# Patient Record
Sex: Female | Born: 1995 | Race: Black or African American | Hispanic: No | Marital: Single | Smoking: Never smoker
Health system: Southern US, Community
[De-identification: ages and names within clinical notes are randomized; demographics above are authoritative.]

## PROBLEM LIST (undated history)

## (undated) DIAGNOSIS — D649 Anemia, unspecified: Secondary | ICD-10-CM

## (undated) DIAGNOSIS — H209 Unspecified iridocyclitis: Secondary | ICD-10-CM

## (undated) DIAGNOSIS — H3581 Retinal edema: Secondary | ICD-10-CM

## (undated) DIAGNOSIS — F419 Anxiety disorder, unspecified: Secondary | ICD-10-CM

## (undated) DIAGNOSIS — H21543 Posterior synechiae (iris), bilateral: Secondary | ICD-10-CM

## (undated) DIAGNOSIS — R87629 Unspecified abnormal cytological findings in specimens from vagina: Secondary | ICD-10-CM

## (undated) DIAGNOSIS — H3090 Unspecified chorioretinal inflammation, unspecified eye: Secondary | ICD-10-CM

## (undated) DIAGNOSIS — E7800 Pure hypercholesterolemia, unspecified: Secondary | ICD-10-CM

## (undated) DIAGNOSIS — I10 Essential (primary) hypertension: Secondary | ICD-10-CM

## (undated) DIAGNOSIS — B977 Papillomavirus as the cause of diseases classified elsewhere: Secondary | ICD-10-CM

## (undated) HISTORY — PX: WISDOM TOOTH EXTRACTION: SHX21

## (undated) HISTORY — DX: Essential (primary) hypertension: I10

## (undated) HISTORY — DX: Pure hypercholesterolemia, unspecified: E78.00

## (undated) HISTORY — PX: COLPOSCOPY: SHX161

## (undated) HISTORY — DX: Unspecified iridocyclitis: H20.9

---

## 2014-12-20 ENCOUNTER — Emergency Department (HOSPITAL_COMMUNITY)
Admission: EM | Admit: 2014-12-20 | Discharge: 2014-12-20 | Disposition: A | Discharge status: Home / Self Care | Payer: BLUE CROSS/BLUE SHIELD | Attending: Emergency Medicine | Admitting: Emergency Medicine

## 2014-12-20 ENCOUNTER — Encounter (HOSPITAL_COMMUNITY): Payer: Self-pay | Admitting: *Deleted

## 2014-12-20 DIAGNOSIS — S0990XA Unspecified injury of head, initial encounter: Secondary | ICD-10-CM | POA: Diagnosis present

## 2014-12-20 DIAGNOSIS — Y998 Other external cause status: Secondary | ICD-10-CM | POA: Insufficient documentation

## 2014-12-20 DIAGNOSIS — Y92410 Unspecified street and highway as the place of occurrence of the external cause: Secondary | ICD-10-CM | POA: Diagnosis not present

## 2014-12-20 DIAGNOSIS — Y9389 Activity, other specified: Secondary | ICD-10-CM | POA: Diagnosis not present

## 2014-12-20 DIAGNOSIS — R519 Headache, unspecified: Secondary | ICD-10-CM

## 2014-12-20 DIAGNOSIS — R51 Headache: Secondary | ICD-10-CM

## 2014-12-20 MED ORDER — IBUPROFEN 400 MG PO TABS
800.0000 mg | ORAL_TABLET | Freq: Once | ORAL | Status: AC
  Administered 2014-12-20: 800 mg via ORAL
  Filled 2014-12-20: qty 2
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
## 2014-12-20 NOTE — Discharge Instructions (Signed)
Return to the emergency room with worsening of symptoms, new symptoms or with symptoms that are concerning , especially severe worsening of headache, visual or speech changes, weakness in face, arms or legs. Ibuprofen 400mg  (2 tablets 200mg ) every 5-6 hours for 3-5 days Follow up with PCP if symptoms worsen or are persistent. Reviewed below information and follow recommendations.  Concussion A concussion, or closed-head injury, is a brain injury caused by a direct blow to the head or by a quick and sudden movement (jolt) of the head or neck. Concussions are usually not life-threatening. Even so, the effects of a concussion can be serious. If you have had a concussion before, you are more likely to experience concussion-like symptoms after a direct blow to the head.  CAUSES  Direct blow to the head, such as from running into another player during a soccer game, being hit in a fight, or hitting your head on a hard surface.  A jolt of the head or neck that causes the brain to move back and forth inside the skull, such as in a car crash. SIGNS AND SYMPTOMS The signs of a concussion can be hard to notice. Early on, they may be missed by you, family members, and health care providers. You may look fine but act or feel differently. Symptoms are usually temporary, but they may last for days, weeks, or even longer. Some symptoms may appear right away while others may not show up for hours or days. Every head injury is different. Symptoms include:  Mild to moderate headaches that will not go away.  A feeling of pressure inside your head.  Having more trouble than usual:  Learning or remembering things you have heard.  Answering questions.  Paying attention or concentrating.  Organizing daily tasks.  Making decisions and solving problems.  Slowness in thinking, acting or reacting, speaking, or reading.  Getting lost or being easily confused.  Feeling tired all the time or lacking energy  (fatigued).  Feeling drowsy.  Sleep disturbances.  Sleeping more than usual.  Sleeping less than usual.  Trouble falling asleep.  Trouble sleeping (insomnia).  Loss of balance or feeling lightheaded or dizzy.  Nausea or vomiting.  Numbness or tingling.  Increased sensitivity to:  Sounds.  Lights.  Distractions.  Vision problems or eyes that tire easily.  Diminished sense of taste or smell.  Ringing in the ears.  Mood changes such as feeling sad or anxious.  Becoming easily irritated or angry for little or no reason.  Lack of motivation.  Seeing or hearing things other people do not see or hear (hallucinations). DIAGNOSIS Your health care provider can usually diagnose a concussion based on a description of your injury and symptoms. He or she will ask whether you passed out (lost consciousness) and whether you are having trouble remembering events that happened right before and during your injury. Your evaluation might include:  A brain scan to look for signs of injury to the brain. Even if the test shows no injury, you may still have a concussion.  Blood tests to be sure other problems are not present. TREATMENT  Concussions are usually treated in an emergency department, in urgent care, or at a clinic. You may need to stay in the hospital overnight for further treatment.  Tell your health care provider if you are taking any medicines, including prescription medicines, over-the-counter medicines, and natural remedies. Some medicines, such as blood thinners (anticoagulants) and aspirin, may increase the chance of complications. Also tell your  health care provider whether you have had alcohol or are taking illegal drugs. This information may affect treatment.  Your health care provider will send you home with important instructions to follow.  How fast you will recover from a concussion depends on many factors. These factors include how severe your concussion is,  what part of your brain was injured, your age, and how healthy you were before the concussion.  Most people with mild injuries recover fully. Recovery can take time. In general, recovery is slower in older persons. Also, persons who have had a concussion in the past or have other medical problems may find that it takes longer to recover from their current injury. HOME CARE INSTRUCTIONS General Instructions  Carefully follow the directions your health care provider gave you.  Only take over-the-counter or prescription medicines for pain, discomfort, or fever as directed by your health care provider.  Take only those medicines that your health care provider has approved.  Do not drink alcohol until your health care provider says you are well enough to do so. Alcohol and certain other drugs may slow your recovery and can put you at risk of further injury.  If it is harder than usual to remember things, write them down.  If you are easily distracted, try to do one thing at a time. For example, do not try to watch TV while fixing dinner.  Talk with family members or close friends when making important decisions.  Keep all follow-up appointments. Repeated evaluation of your symptoms is recommended for your recovery.  Watch your symptoms and tell others to do the same. Complications sometimes occur after a concussion. Older adults with a brain injury may have a higher risk of serious complications, such as a blood clot on the brain.  Tell your teachers, school nurse, school counselor, coach, athletic trainer, or work Freight forwarder about your injury, symptoms, and restrictions. Tell them about what you can or cannot do. They should watch for:  Increased problems with attention or concentration.  Increased difficulty remembering or learning new information.  Increased time needed to complete tasks or assignments.  Increased irritability or decreased ability to cope with stress.  Increased  symptoms.  Rest. Rest helps the brain to heal. Make sure you:  Get plenty of sleep at night. Avoid staying up late at night.  Keep the same bedtime hours on weekends and weekdays.  Rest during the day. Take daytime naps or rest breaks when you feel tired.  Limit activities that require a lot of thought or concentration. These include:  Doing homework or job-related work.  Watching TV.  Working on the computer.  Avoid any situation where there is potential for another head injury (football, hockey, soccer, basketball, martial arts, downhill snow sports and horseback riding). Your condition will get worse every time you experience a concussion. You should avoid these activities until you are evaluated by the appropriate follow-up health care providers. Returning To Your Regular Activities You will need to return to your normal activities slowly, not all at once. You must give your body and brain enough time for recovery.  Do not return to sports or other athletic activities until your health care provider tells you it is safe to do so.  Ask your health care provider when you can drive, ride a bicycle, or operate heavy machinery. Your ability to react may be slower after a brain injury. Never do these activities if you are dizzy.  Ask your health care provider about when  you can return to work or school. Preventing Another Concussion It is very important to avoid another brain injury, especially before you have recovered. In rare cases, another injury can lead to permanent brain damage, brain swelling, or death. The risk of this is greatest during the first 7-10 days after a head injury. Avoid injuries by:  Wearing a seat belt when riding in a car.  Drinking alcohol only in moderation.  Wearing a helmet when biking, skiing, skateboarding, skating, or doing similar activities.  Avoiding activities that could lead to a second concussion, such as contact or recreational sports, until  your health care provider says it is okay.  Taking safety measures in your home.  Remove clutter and tripping hazards from floors and stairways.  Use grab bars in bathrooms and handrails by stairs.  Place non-slip mats on floors and in bathtubs.  Improve lighting in dim areas. SEEK MEDICAL CARE IF:  You have increased problems paying attention or concentrating.  You have increased difficulty remembering or learning new information.  You need more time to complete tasks or assignments than before.  You have increased irritability or decreased ability to cope with stress.  You have more symptoms than before. Seek medical care if you have any of the following symptoms for more than 2 weeks after your injury:  Lasting (chronic) headaches.  Dizziness or balance problems.  Nausea.  Vision problems.  Increased sensitivity to noise or light.  Depression or mood swings.  Anxiety or irritability.  Memory problems.  Difficulty concentrating or paying attention.  Sleep problems.  Feeling tired all the time. SEEK IMMEDIATE MEDICAL CARE IF:  You have severe or worsening headaches. These may be a sign of a blood clot in the brain.  You have weakness (even if only in one hand, leg, or part of the face).  You have numbness.  You have decreased coordination.  You vomit repeatedly.  You have increased sleepiness.  One pupil is larger than the other.  You have convulsions.  You have slurred speech.  You have increased confusion. This may be a sign of a blood clot in the brain.  You have increased restlessness, agitation, or irritability.  You are unable to recognize people or places.  You have neck pain.  It is difficult to wake you up.  You have unusual behavior changes.  You lose consciousness. MAKE SURE YOU:  Understand these instructions.  Will watch your condition.  Will get help right away if you are not doing well or get worse. Document  Released: 02/12/2004 Document Revised: 11/27/2013 Document Reviewed: 06/14/2013 [facility] Patient Information 2015 [place], [place]. This information is not intended to replace advice given to you by your health care provider. Make sure you discuss any questions you have with your health care provider.
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
## 2021-04-12 ENCOUNTER — Emergency Department (HOSPITAL_COMMUNITY)
Admission: EM | Admit: 2021-04-12 | Discharge: 2021-04-12 | Disposition: A | Discharge status: Home / Self Care | Payer: BC Managed Care – PPO | Attending: Emergency Medicine | Admitting: Emergency Medicine

## 2021-04-12 ENCOUNTER — Emergency Department (HOSPITAL_COMMUNITY): Payer: BC Managed Care – PPO

## 2021-04-12 ENCOUNTER — Other Ambulatory Visit: Payer: Self-pay

## 2021-04-12 DIAGNOSIS — R7401 Elevation of levels of liver transaminase levels: Secondary | ICD-10-CM | POA: Diagnosis not present

## 2021-04-12 DIAGNOSIS — R748 Abnormal levels of other serum enzymes: Secondary | ICD-10-CM

## 2021-04-12 DIAGNOSIS — D75839 Thrombocytosis, unspecified: Secondary | ICD-10-CM

## 2021-04-12 DIAGNOSIS — D473 Essential (hemorrhagic) thrombocythemia: Secondary | ICD-10-CM | POA: Insufficient documentation

## 2021-04-12 DIAGNOSIS — R799 Abnormal finding of blood chemistry, unspecified: Secondary | ICD-10-CM | POA: Diagnosis present

## 2021-04-12 LAB — CBC WITH DIFFERENTIAL/PLATELET
Abs Immature Granulocytes: 0.03 10*3/uL (ref 0.00–0.07)
Basophils Absolute: 0 10*3/uL (ref 0.0–0.1)
Basophils Relative: 0 %
Eosinophils Absolute: 0 10*3/uL (ref 0.0–0.5)
Eosinophils Relative: 0 %
HCT: 39.4 % (ref 36.0–46.0)
Hemoglobin: 12.6 g/dL (ref 12.0–15.0)
Immature Granulocytes: 0 %
Lymphocytes Relative: 4 %
Lymphs Abs: 0.3 10*3/uL — ABNORMAL LOW (ref 0.7–4.0)
MCH: 31.3 pg (ref 26.0–34.0)
MCHC: 32 g/dL (ref 30.0–36.0)
MCV: 98 fL (ref 80.0–100.0)
Monocytes Absolute: 0.3 10*3/uL (ref 0.1–1.0)
Monocytes Relative: 4 %
Neutro Abs: 7.7 10*3/uL (ref 1.7–7.7)
Neutrophils Relative %: 92 %
Platelets: 597 10*3/uL — ABNORMAL HIGH (ref 150–400)
RBC: 4.02 MIL/uL (ref 3.87–5.11)
RDW: 14.3 % (ref 11.5–15.5)
WBC: 8.4 10*3/uL (ref 4.0–10.5)
nRBC: 0 % (ref 0.0–0.2)

## 2021-04-12 LAB — HEPATITIS PANEL, ACUTE
HCV Ab: NONREACTIVE
Hep A IgM: NONREACTIVE
Hep B C IgM: NONREACTIVE
Hepatitis B Surface Ag: NONREACTIVE

## 2021-04-12 LAB — LIPASE, BLOOD: Lipase: 41 U/L (ref 11–51)

## 2021-04-12 LAB — COMPREHENSIVE METABOLIC PANEL
ALT: 117 U/L — ABNORMAL HIGH (ref 0–44)
AST: 75 U/L — ABNORMAL HIGH (ref 15–41)
Albumin: 3.5 g/dL (ref 3.5–5.0)
Alkaline Phosphatase: 86 U/L (ref 38–126)
Anion gap: 9 (ref 5–15)
BUN: 8 mg/dL (ref 6–20)
CO2: 26 mmol/L (ref 22–32)
Calcium: 9.1 mg/dL (ref 8.9–10.3)
Chloride: 101 mmol/L (ref 98–111)
Creatinine, Ser: 0.87 mg/dL (ref 0.44–1.00)
GFR, Estimated: >60 mL/min (ref >60)
Glucose, Bld: 164 mg/dL — ABNORMAL HIGH (ref 70–99)
Potassium: 3.5 mmol/L (ref 3.5–5.1)
Sodium: 136 mmol/L (ref 135–145)
Total Bilirubin: 2.8 mg/dL — ABNORMAL HIGH (ref 0.3–1.2)
Total Protein: 8.3 g/dL — ABNORMAL HIGH (ref 6.5–8.1)

## 2021-04-12 LAB — I-STAT BETA HCG BLOOD, ED (MC, WL, AP ONLY): I-stat hCG, quantitative: <5 m[IU]/mL (ref <5)

## 2021-04-12 NOTE — ED Notes (Signed)
Family at bedside. 

## 2021-04-12 NOTE — ED Triage Notes (Addendum)
Patient says she went to urgent care yesterday for itching on palms and soles of feet, says bloodwork was done and today UC called patient telling patient to come to ED because bilirubin levels are twice the normal range. Patient does not appear jaundiced in triage. Denies pain. Patient says she takes a lot of immunosuppressant medications

## 2021-04-12 NOTE — ED Provider Notes (Signed)
Received signout from previous provider, please see her note for complete H&P.  Patient sent here due to elevated total bili as well as transaminitis.  No significant pain.  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  Suspect abnormal labs may be secondary to recent medication change.  A limited abdominal ultrasound was obtained today that does show evidence of cholelithiasis without evidence of cholecystitis.  Normal common bile duct.  I suspect this is likely an incidental finding and not likely to be related to her abnormal labs.  She does not have any significant pain or symptoms to suggest cholecystitis  Patient will reach out to her PCP tomorrow to decide if she should continue with her medication.  Patient also understand to return if she develop abdominal pain or any signs of suggest gallbladder etiology.  At this time she is stable for discharge.  BP 136/82   Pulse 64   Temp 98.6 F (37 C) (Oral)   Resp 17   Ht 5\' 9"  (1.753 m)   Wt 83.2 kg   LMP 03/18/2021   SpO2 100%   BMI 27.10 kg/m   Results for orders placed or performed during the hospital encounter of 04/12/21  Comprehensive metabolic panel  Result Value Ref Range   Sodium 136 135 - 145 mmol/L   Potassium 3.5 3.5 - 5.1 mmol/L   Chloride 101 98 - 111 mmol/L   CO2 26 22 - 32 mmol/L   Glucose, Bld 164 (H) 70 - 99 mg/dL   BUN 8 6 - 20 mg/dL   Creatinine, Ser 0.87 0.44 - 1.00 mg/dL   Calcium 9.1 8.9 - 10.3 mg/dL   Total Protein 8.3 (H) 6.5 - 8.1 g/dL   Albumin 3.5 3.5 - 5.0 g/dL   AST 75 (H) 15 - 41 U/L   ALT 117 (H) 0 - 44 U/L   Alkaline Phosphatase 86 38 - 126 U/L   Total Bilirubin 2.8 (H) 0.3 - 1.2 mg/dL   GFR, Estimated >60 >60 mL/min   Anion gap 9 5 - 15  Lipase, blood  Result Value Ref Range   Lipase 41 11 - 51 U/L  CBC with Differential  Result Value Ref Range   WBC 8.4 4.0 - 10.5 K/uL   RBC 4.02 3.87 - 5.11 MIL/uL   Hemoglobin 12.6 12.0 - 15.0 g/dL   HCT 39.4 36.0 - 46.0 %   MCV 98.0 80.0 - 100.0 fL   MCH  31.3 26.0 - 34.0 pg   MCHC 32.0 30.0 - 36.0 g/dL   RDW 14.3 11.5 - 15.5 %   Platelets 597 (H) 150 - 400 K/uL   nRBC 0.0 0.0 - 0.2 %   Neutrophils Relative % 92 %   Neutro Abs 7.7 1.7 - 7.7 K/uL   Lymphocytes Relative 4 %   Lymphs Abs 0.3 (L) 0.7 - 4.0 K/uL   Monocytes Relative 4 %   Monocytes Absolute 0.3 0.1 - 1.0 K/uL   Eosinophils Relative 0 %   Eosinophils Absolute 0.0 0.0 - 0.5 K/uL   Basophils Relative 0 %   Basophils Absolute 0.0 0.0 - 0.1 K/uL   Immature Granulocytes 0 %   Abs Immature Granulocytes 0.03 0.00 - 0.07 K/uL  I-Stat beta hCG blood, ED  Result Value Ref Range   I-stat hCG, quantitative <5.0 <5 mIU/mL   Comment 3           US Abdomen Limited RUQ (LIVER/GB)  Result Date: 04/12/2021 CLINICAL DATA:  Increased liver function  tests. EXAM: ULTRASOUND ABDOMEN LIMITED RIGHT UPPER QUADRANT COMPARISON:  None. FINDINGS: Gallbladder: Gallbladder calculi are seen, measuring up to 7 mm. No wall thickening visualized. No sonographic Murphy sign noted by sonographer. Common bile duct: Diameter: 4 mm Liver: No focal lesion identified. Within normal limits in parenchymal echogenicity. Portal vein is patent on color Doppler imaging with normal direction of blood flow towards the liver. Other: None. IMPRESSION: Cholelithiasis without evidence of acute cholecystitis. No biliary dilatation. Electronically Signed   By: Zerita Boers M.D.   On: 04/12/2021 15:12      Domenic Moras, PA-C 04/12/21 1530    Trifan, Carola Rhine, MD 04/12/21 [phone]

## 2021-04-12 NOTE — ED Provider Notes (Signed)
[facility] DEPT Provider Note   CSN: [id number] Arrival date & time: 04/12/21  1208     History Chief Complaint  Patient presents with  . elevated bilirubin    Karen Young is a 25 y.o. 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  States that for the past week she has been having pruritus throughout her extremities and hives on her back.  She is unsure of anything that may have triggered this, no new soaps, lotions, detergents or food intake.  She was seen and evaluated at urgent care yesterday and was prescribed antihistamines and prednisone which has helped her symptoms.  However blood work was obtained there and she was called today due to elevated bilirubin level that was thought to be from her medication.  She was told to come to the ER.  She denies any abdominal pain, vomiting, lip or tongue swelling, shortness of breath, fever, jaundice, history of liver or gallbladder issues, excessive alcohol or Tylenol use.  HPI     No past medical history on file.  There are no problems to display for this patient.   No past surgical history on file.   OB History   No obstetric history on file.     No family history on file.  Social History   Tobacco Use  . Smoking status: Never Smoker  Substance Use Topics  . Alcohol use: No    Home Medications Prior to Admission medications   Medication Sig Start Date End Date Taking? Authorizing Provider  azaTHIOprine (IMURAN) 50 MG tablet Take 100 mg by mouth 2 (two) times daily. 03/16/21  Yes [provider]  brimonidine (ALPHAGAN) 0.2 % ophthalmic solution Place 1 drop into both eyes 3 (three) times daily. 12/29/20  Yes [provider]  dorzolamide-timolol (COSOPT) 22.3-6.8 MG/ML ophthalmic solution Place 1 drop into both eyes 2 (two) times daily. 11/18/20  Yes [provider]  etonogestrel-ethinyl estradiol  (NUVARING) 0.12-0.015 MG/24HR vaginal ring Place 1 each vaginally every 21 ( twenty-one) days. 01/13/21  Yes [provider]  famotidine (PEPCID) 40 MG tablet Take 40 mg by mouth daily. 04/11/21  Yes [provider]  folic acid (FOLVITE) 1 MG tablet Take 1 mg by mouth daily.   Yes [provider]  HUMIRA PEN 40 MG/0.4ML PNKT Inject 40 mg as directed every 14 (fourteen) days. 02/08/21  Yes [provider]  hydrochlorothiazide (HYDRODIURIL) 12.5 MG tablet Take 12.5 mg by mouth daily.   Yes [provider]  hydrOXYzine (ATARAX/VISTARIL) 25 MG tablet Take 25-50 mg by mouth at bedtime as needed for itching.   Yes [provider]  Multiple Vitamin (MULTIVITAMIN WITH MINERALS) TABS tablet Take 1 tablet by mouth daily.   Yes [provider]  predniSONE (DELTASONE) 10 MG tablet Take 10-60 mg by mouth as directed. Titrate dosing on Package # 48 DS 12 04/11/21  Yes [provider]    Allergies    Patient has no known allergies.  Review of Systems   Review of Systems  Constitutional: Negative for appetite change, chills and fever.  HENT: Negative for ear pain, rhinorrhea, sneezing and sore throat.   Eyes: Negative for photophobia and visual disturbance.  Respiratory: Negative for cough, chest tightness, shortness of breath and wheezing.   Cardiovascular: Negative for chest pain and palpitations.  Gastrointestinal: Negative for abdominal pain, blood in stool, constipation, diarrhea, nausea and vomiting.  Genitourinary: Negative for  dysuria, hematuria and urgency.  Musculoskeletal: Negative for myalgias.  Skin: Negative for rash.  Neurological: Negative for dizziness, weakness and light-headedness.    Physical Exam Updated Vital Signs BP 136/82   Pulse 64   Temp 98.6 F (37 C) (Oral)   Resp 17   Ht 5\' 9"  (1.753 m)   Wt 83.2 kg   LMP 03/18/2021   SpO2 100%   BMI 27.10 kg/m   Physical Exam Vitals and nursing note reviewed.   Constitutional:      General: She is not in acute distress.    Appearance: She is well-developed.     Comments: No signs of angioedema or anaphylaxis  HENT:     Head: Normocephalic and atraumatic.     Nose: Nose normal.  Eyes:     General: No scleral icterus.       Left eye: No discharge.     Conjunctiva/sclera: Conjunctivae normal.  Cardiovascular:     Rate and Rhythm: Normal rate and regular rhythm.     Heart sounds: Normal heart sounds. No murmur heard. No friction rub. No gallop.   Pulmonary:     Effort: Pulmonary effort is normal. No respiratory distress.     Breath sounds: Normal breath sounds.  Abdominal:     General: Bowel sounds are normal. There is no distension.     Palpations: Abdomen is soft.     Tenderness: There is no abdominal tenderness. There is no guarding.     Comments: Abdomen is soft, nontender nondistended.  Specifically no right upper quadrant or epigastric pain  Musculoskeletal:        General: Normal range of motion.     Cervical back: Normal range of motion and neck supple.  Skin:    General: Skin is warm and dry.     Coloration: Skin is not jaundiced.     Findings: No rash.     Comments: No rash noted.  Neurological:     Mental Status: She is alert.     Motor: No abnormal muscle tone.     Coordination: Coordination normal.     ED Results / Procedures / Treatments   Labs (all labs ordered are listed, but only abnormal results are displayed) Labs Reviewed  COMPREHENSIVE METABOLIC PANEL - Abnormal; Notable for the following components:      Result Value   Glucose, Bld 164 (*)    Total Protein 8.3 (*)    AST 75 (*)    ALT 117 (*)    Total Bilirubin 2.8 (*)    All other components within normal limits  CBC WITH DIFFERENTIAL/PLATELET - Abnormal; Notable for the following components:   Platelets 597 (*)    Lymphs Abs 0.3 (*)    All other components within normal limits  LIPASE, BLOOD  HEPATITIS PANEL, ACUTE  I-STAT BETA HCG BLOOD, ED ([facility],  [facility], [facility] ONLY)    EKG None  Radiology No results found.  Procedures Procedures   Medications Ordered in ED Medications - No data to display  ED Course  I have reviewed the triage vital signs and the nursing notes.  Pertinent labs & imaging results that were available during my care of the patient were reviewed by me and considered in my medical decision making (see chart for details).  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  Reports pruritus and hives on her back for the past week without a known trigger.  Evaluated at urgent care yesterday and was given steroids and antihistamines.  This improved her symptoms significantly.  However she was called today due to blood work collected yesterday at urgent care showing elevated bilirubin levels.  Due to her being on immunosuppressive medication she was told to come to the ER.  Patient without any abdominal pain, vomiting, jaundice, rash, chest pain or shortness of breath.  She appears comfortable on exam.  Abdomen is soft and nontender.  She denies history of liver or gallbladder disease, excessive Tylenol or alcohol use.  She has been on the Humira since January and she feels that it is helping her without any significant side effects.  I am unable to see the labs from the urgent care so we will collect lab work including CBC, CMP, lipase and pregnancy test.  Lab work today does show a T bili of 2.8, ALT of 117 and AST of 75, these were normal on  01/30/21.  CBC with similar elevation in platelets. Normal lipase. She has thrombocytosis which has been present since February. I believe her transaminitis could be related to her Humira use but will need to exclude other potential cause with RUQ u/s and will obtain hepatitis panel for PCP to follow up on. Care handed off to oncoming provider pending remainder of workup. I anticipate discharge home with PCP and eye specialist if U/S without abnormalities.   Portions of this note were generated with Lobbyist. Dictation errors may occur despite best attempts at proofreading.  Final Clinical Impression(s) / ED Diagnoses Final diagnoses:  Transaminitis  Elevated liver enzymes  Thrombocytosis    Rx / DC Orders ED Discharge Orders    None       Delia Heady, PA-C 04/12/21 1500    Dorie Rank, MD 04/12/21 [phone]

## 2021-04-12 NOTE — Discharge Instructions (Addendum)
Follow up with your ophthalmologist and primary care provider regarding your lab work and imaging studies today to discuss this potential side effect of your medications. We will contact you if your hepatitis panel is positive. Return to the ER if you start to experience severe abdominal pain, fever, yellowing of your skin, chest pain or shortness of breath.

## 2021-08-19 LAB — OB RESULTS CONSOLE HIV ANTIBODY (ROUTINE TESTING): HIV: NONREACTIVE

## 2021-08-19 LAB — OB RESULTS CONSOLE ABO/RH: RH Type: POSITIVE

## 2021-08-19 LAB — OB RESULTS CONSOLE RUBELLA ANTIBODY, IGM: Rubella: IMMUNE

## 2021-08-19 LAB — OB RESULTS CONSOLE HEPATITIS B SURFACE ANTIGEN: Hepatitis B Surface Ag: NEGATIVE

## 2021-08-19 LAB — OB RESULTS CONSOLE ANTIBODY SCREEN: Antibody Screen: NEGATIVE

## 2021-08-19 LAB — HEPATITIS C ANTIBODY: HCV Ab: NEGATIVE

## 2021-08-19 LAB — OB RESULTS CONSOLE RPR: RPR: NONREACTIVE

## 2021-08-24 LAB — OB RESULTS CONSOLE GC/CHLAMYDIA
Chlamydia: NEGATIVE
Gonorrhea: NEGATIVE

## 2021-12-06 NOTE — L&D Delivery Note (Addendum)
Operative Delivery Note ?At 6:08 PM a viable female was delivered via Vaginal, Vacuum Neurosurgeon).  Presentation: vertex; Position: Occiput,, Posterior; Station: +2. ?Vacuum applied for prolonged fetal decelerations with pushing.  ?Verbal consent: obtained from patient.  Risks and benefits discussed in detail.  Risks include, but are not limited to the risks of anesthesia, bleeding, infection, damage to maternal tissues, fetal cephalhematoma.  There is also the risk of inability to effect vaginal delivery of the head, or shoulder dystocia that cannot be resolved by established maneuvers, leading to the need for emergency cesarean section. ?Vacuum applied after emptying bladder.  Delivery effected within 3 minutes of vacuum application. 3 pulls, no pop offs. Vacuum pressure applied to green zone during pulls, vacuum pressure released when patient was not pushing.  ?APGAR: 1, 6, 9; weight 6 lb 1.4 oz (2760 g).   ?Placenta status: Normal ?Cord:  with the following complications: .  Cord pH: specimen collected and was to be sent, per RN specimen clotted therefore was not sent.  ?Anesthesia:  Epidural.  ?Instruments: Kiwi ?Episiotomy: None ?Lacerations: 1st degree  ?Suture Repair: 3.0 vicryl ?Est. Blood Loss (mL): 50 ? ?Mom to postpartum.  Baby to Couplet care / Skin to Skin. ? ?Archie Endo, MD.  ?03/10/2022, 8:44 PM ? ? ? ?

## 2021-12-27 IMAGING — US US ABDOMEN LIMITED RUQ/ASCITES
1 series · 15 of 25 positions shown · non-contrast
Comparison: None.

CLINICAL DATA: Increased liver function tests.

EXAM:
ULTRASOUND ABDOMEN LIMITED RIGHT UPPER QUADRANT

[Series 1: us abdomen limited ruq mc & wl · 15 of 35 slices shown]
[im 1/35]
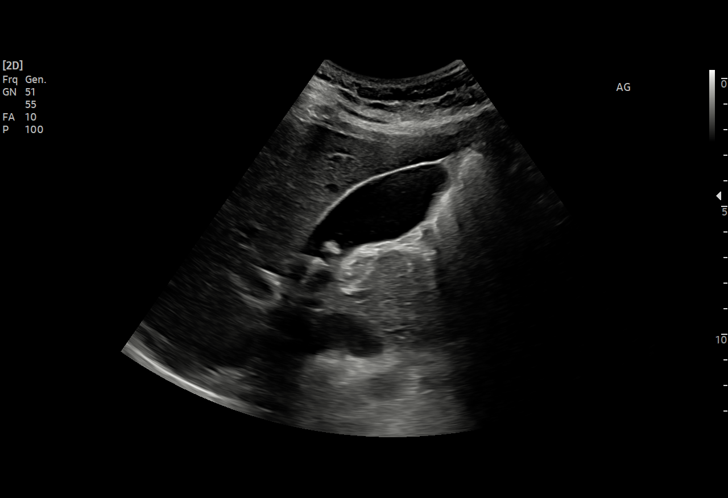
[im 3/35]
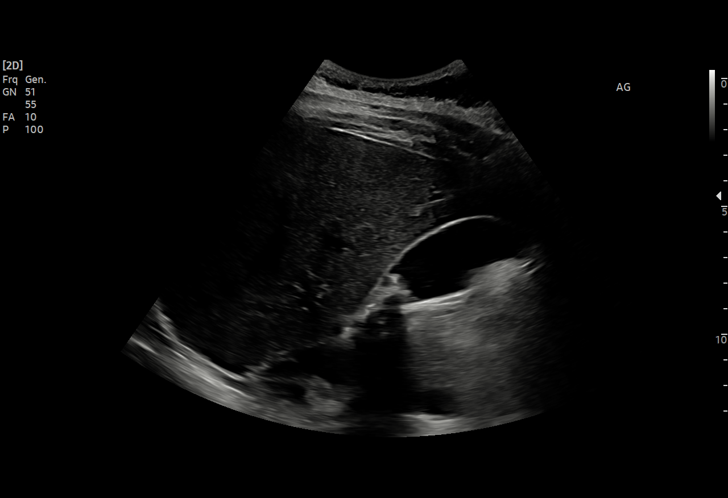
[im 6/35]
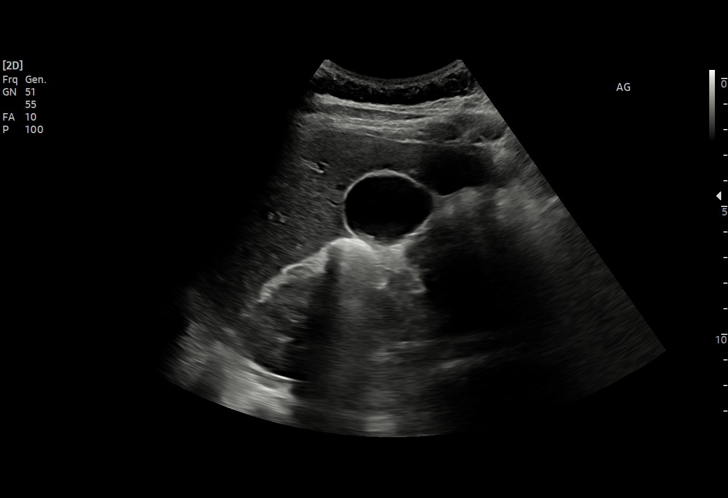
[im 8/35]
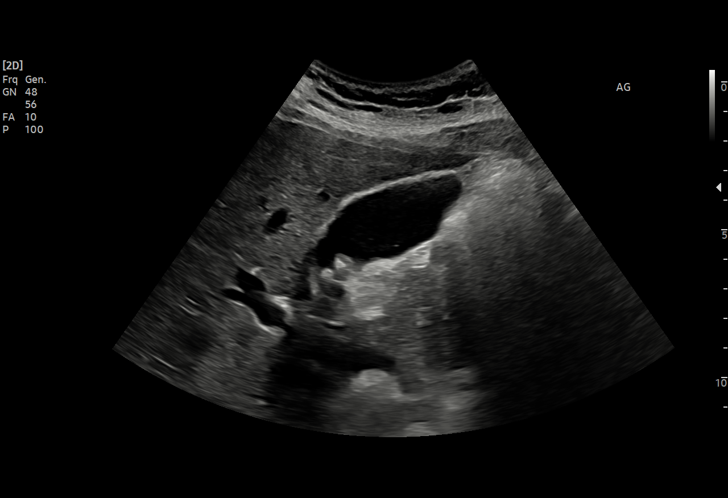
[im 10/35]
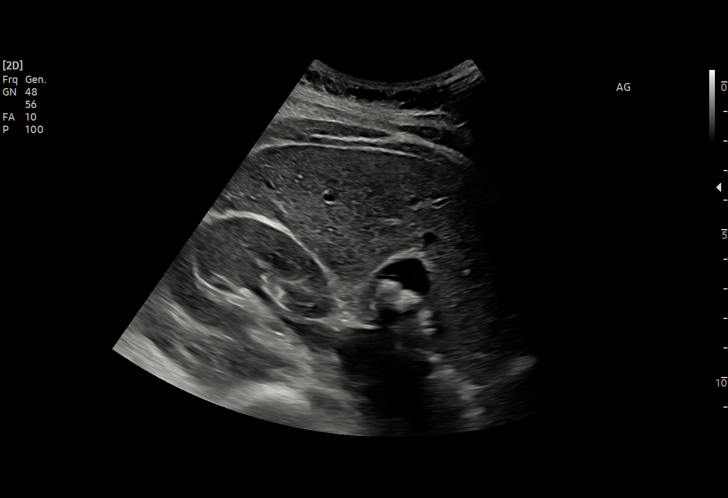
[im 13/35]
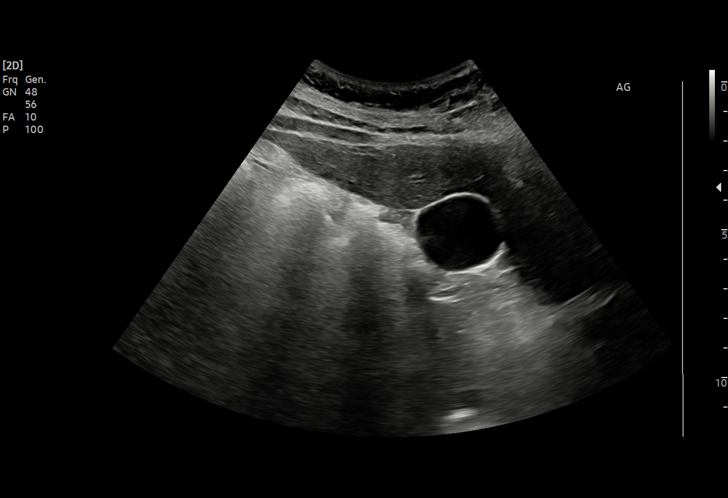
[im 15/35]
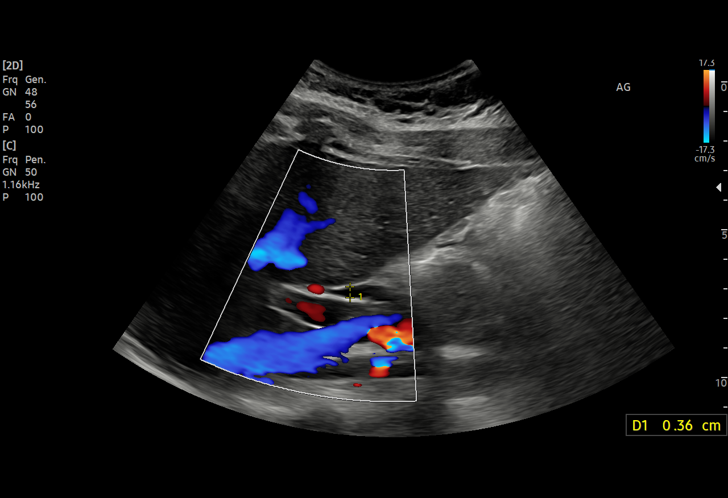
[im 18/35]
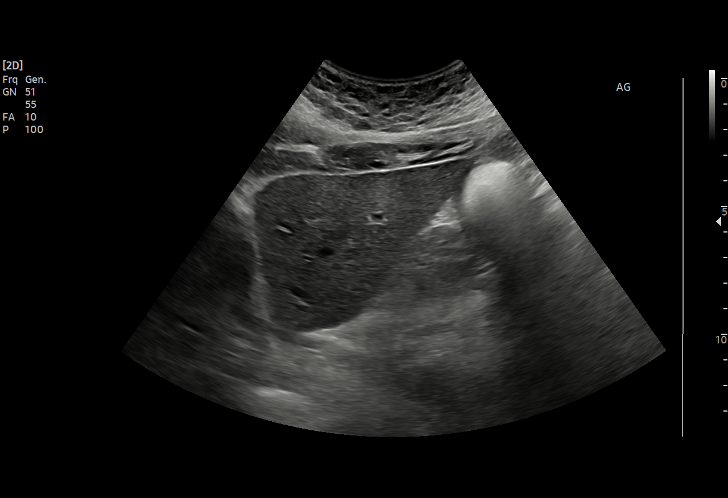
[im 20/35]
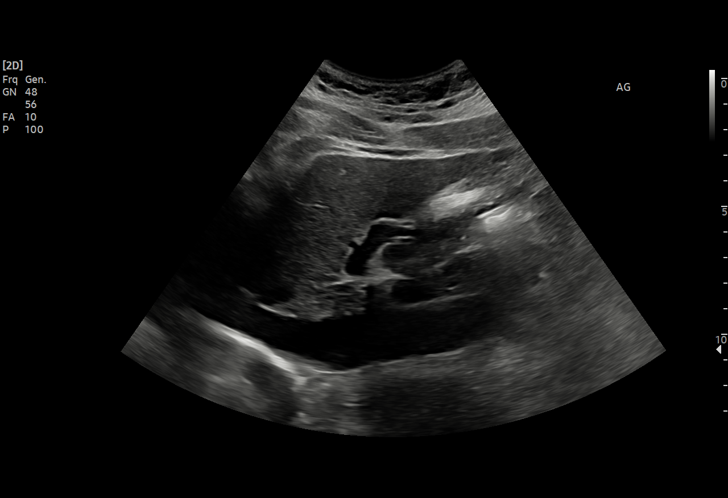
[im 22/35]
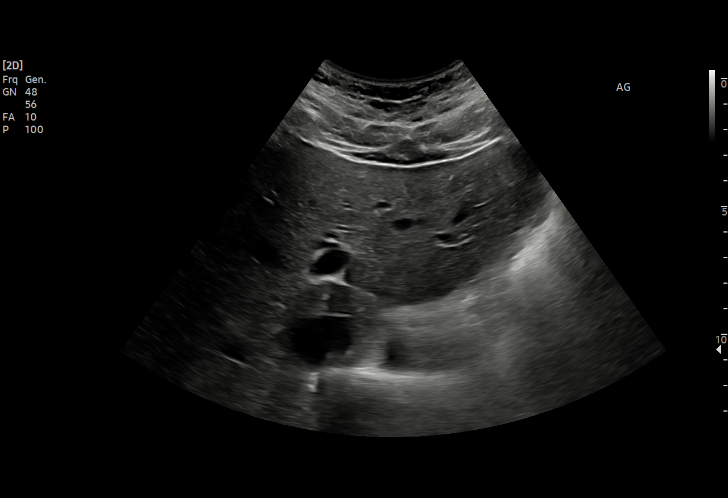
[im 25/35]
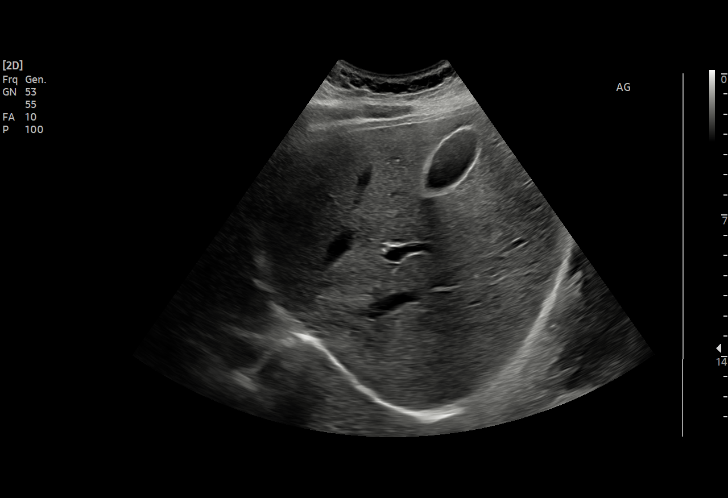
[im 27/35]
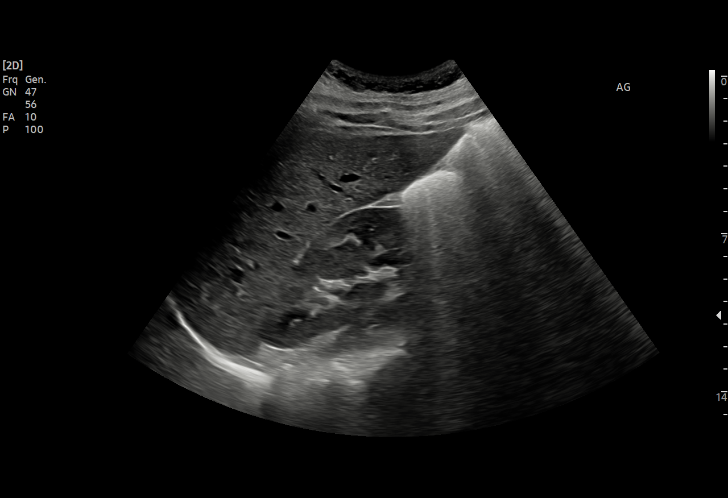
[im 29/35]
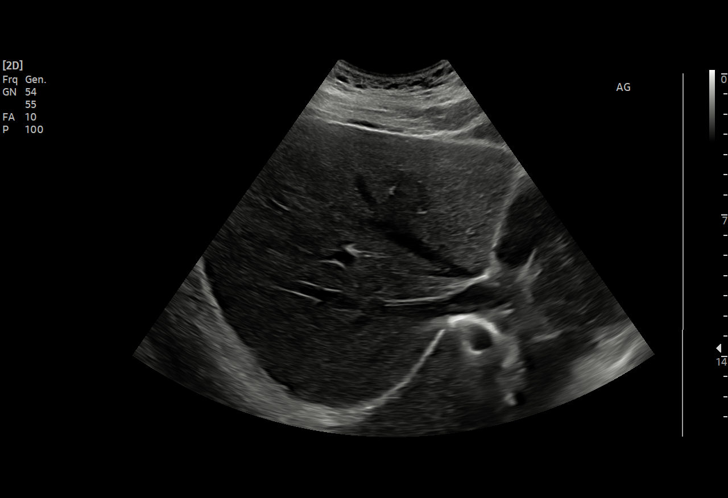
[im 32/35]
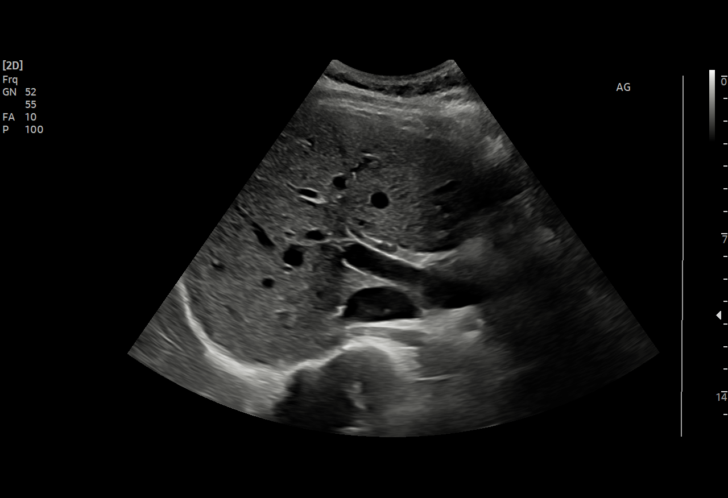
[im 35/35]
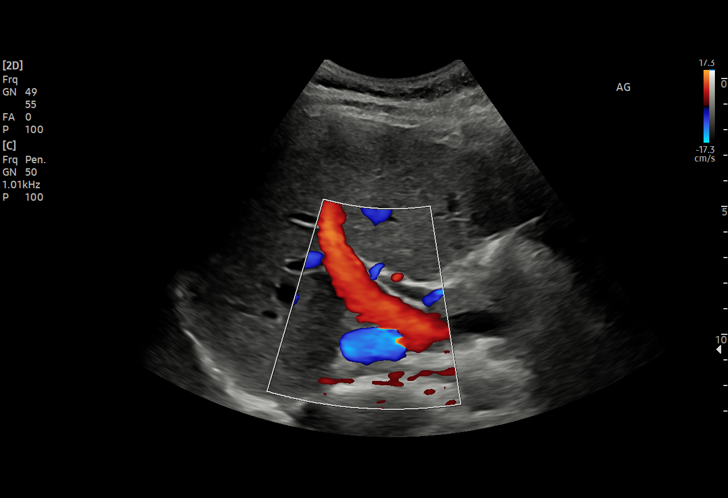

[15 of 25 positions shown; findings below may reference images not displayed]

FINDINGS: Gallbladder:

Gallbladder calculi are seen, measuring up to 7 mm. No wall
thickening visualized. No sonographic Murphy sign noted by
sonographer.

Common bile duct:

Diameter: 4 mm

Liver:

No focal lesion identified. Within normal limits in parenchymal
echogenicity. Portal vein is patent on color Doppler imaging with
normal direction of blood flow towards the liver.

Other: None.
IMPRESSION: Cholelithiasis without evidence of acute cholecystitis. No biliary
dilatation.

## 2022-02-16 LAB — OB RESULTS CONSOLE GBS: GBS: NEGATIVE

## 2022-02-23 ENCOUNTER — Encounter (HOSPITAL_COMMUNITY): Payer: Self-pay | Admitting: *Deleted

## 2022-02-23 ENCOUNTER — Telehealth (HOSPITAL_COMMUNITY): Payer: Self-pay | Admitting: *Deleted

## 2022-02-23 NOTE — Telephone Encounter (Signed)
Preadmission screen  

## 2022-03-08 ENCOUNTER — Encounter (HOSPITAL_COMMUNITY): Payer: Self-pay | Admitting: Obstetrics and Gynecology

## 2022-03-08 DIAGNOSIS — I10 Essential (primary) hypertension: Secondary | ICD-10-CM | POA: Diagnosis present

## 2022-03-08 DIAGNOSIS — H209 Unspecified iridocyclitis: Secondary | ICD-10-CM | POA: Diagnosis present

## 2022-03-08 DIAGNOSIS — D649 Anemia, unspecified: Secondary | ICD-10-CM | POA: Diagnosis present

## 2022-03-08 DIAGNOSIS — F419 Anxiety disorder, unspecified: Secondary | ICD-10-CM | POA: Diagnosis present

## 2022-03-08 NOTE — H&P (Signed)
OB ADMISSION/ HISTORY & PHYSICAL: ? ?Admission Date: 03/09/2022  5:20 AM  ?Admit Diagnosis: Chronic hypertension ? ?Karen Young is a 26 y.o. female G24P0020 [id number] for induction of labor for primary hypertension managed on Labetalol 100 mg BID. Endorses active FM, denies LOF and vaginal bleeding.  ? ?History of current pregnancy: ?G3P0020   ?Patient entered care with [facility] at 12+5 wks.   ?EDC 03/16/22 by LMP and congruent w/ 10+1 wk U/S.   ?Anatomy scan:  21+2 was incomplete, follow-up at 26.2 wks, complete w/ anterior placenta.   ?Antenatal testing: for hypertension started at 30 weeks ?Last evaluation: 38 wks vertex. Anterior placenta/ AFI 8.5/ EFW 7+1 48% BPP 8/8 ? ?Significant prenatal events:  ?Patient Active Problem List  ? Diagnosis Date Noted  ? Chronic hypertension affecting pregnancy 03/09/2022  ? Hypertension   ? Uveitis   ? Anxiety   ? Anemia   ? ? ?Prenatal Labs: ?ABO, Rh: A/Positive/-- (09/14 0000) ?Antibody: Negative (09/14 0000) ?Rubella: Immune (09/14 0000)  ?RPR: Nonreactive (09/14 0000)  ?HBsAg: Negative (09/14 0000)  ?HIV: Non-reactive (09/14 0000)  ?GTT: normal 1 hr screening ?GBS: Negative/-- (03/14 0000)  ?GC/CHL: neg/neg ?Genetics: low-risk Panorama and negative Horizon ?Tdap/influenza vaccines: declined both this pregnancy ? ? ?OB History  ?Gravida Para Term Preterm AB Living  ?3       2    ?SAB IAB Ectopic Multiple Live Births  ?  2        ?  ?# Outcome Date GA Lbr Len/2nd Weight Sex Delivery Anes PTL Lv  ?3 Current           ?2 IAB 10/11/20          ?1 IAB 04/05/18          ? ? ?Medical / Surgical History: ?Past medical history:  ?Past Medical History:  ?Diagnosis Date  ? Anemia   ? Anxiety   ? Chorioretinitis   ? Elevated cholesterol   ? HPV (human papilloma virus) infection   ? Hypertension   ? Posterior synechiae of iris, bilateral   ? Retinal edema   ? Uveitis   ? Vaginal Pap smear, abnormal   ?  ?Past surgical history:  ?Past Surgical History:  ?Procedure Laterality Date  ?  COLPOSCOPY    ? WISDOM TOOTH EXTRACTION    ? ?Family History:  ?Family History  ?Problem Relation Age of Onset  ? Hyperlipidemia Mother   ? Hypertension Mother   ? Rheum arthritis Mother   ? Hyperlipidemia Father   ? Heart attack Paternal Aunt   ? Stroke Paternal Aunt   ? Diabetes Maternal Grandmother   ? Diabetes Paternal Grandmother   ? Stroke Paternal Grandmother   ? Heart attack Paternal Grandmother   ? Heart disease Paternal Grandfather   ?  ?Social History:  reports that she has never smoked. She does not have any smokeless tobacco history on file. She reports that she does not drink alcohol and does not use drugs. ? ?Allergies: ?Patient has no known allergies. ?  ?Current Medications at time of admission:  ?Prior to Admission medications   ?Medication Sig Start Date End Date Taking? Authorizing Provider  ?azaTHIOprine (IMURAN) 50 MG tablet Take 100 mg by mouth 2 (two) times daily. 03/16/21   [provider]  ?brimonidine (ALPHAGAN) 0.2 % ophthalmic solution Place 1 drop into both eyes 3 (three) times daily. 12/29/20   [provider]  ?dorzolamide-timolol (COSOPT) 22.3-6.8 MG/ML ophthalmic solution Place 1 drop into  both eyes 2 (two) times daily. 11/18/20   [provider]  ?etonogestrel-ethinyl estradiol (NUVARING) 0.12-0.015 MG/24HR vaginal ring Place 1 each vaginally every 21 ( twenty-one) days. 01/13/21   [provider]  ?famotidine (PEPCID) 40 MG tablet Take 40 mg by mouth daily. 04/11/21   [provider]  ?folic acid (FOLVITE) 1 MG tablet Take 1 mg by mouth daily.    [provider]  ?HUMIRA PEN 40 MG/0.4ML PNKT Inject 40 mg as directed every 14 (fourteen) days. 02/08/21   [provider]  ?hydrochlorothiazide (HYDRODIURIL) 12.5 MG tablet Take 12.5 mg by mouth daily.    [provider]  ?hydrOXYzine (ATARAX/VISTARIL) 25 MG tablet Take 25-50 mg by mouth at bedtime as needed for itching.    [provider]  ?Multiple Vitamin  (MULTIVITAMIN WITH MINERALS) TABS tablet Take 1 tablet by mouth daily.    [provider]  ?predniSONE (DELTASONE) 10 MG tablet Take 10-60 mg by mouth as directed. Titrate dosing on Package # 48 DS 12 04/11/21   [provider]  ? ? ?Review of Systems: ?Constitutional: Negative   ?HENT: Negative   ?Eyes: Negative   ?Respiratory: Negative   ?Cardiovascular: Negative   ?Gastrointestinal: Negative  ?Genitourinary: neg for bloody show, nwg for LOF   ?Musculoskeletal: Negative   ?Skin: Negative   ?Neurological: Negative   ?Endo/Heme/Allergies: Negative   ?Psychiatric/Behavioral: Negative  ? ? ?Physical Exam: ?VS: Blood pressure 140/87, pulse 63, temperature 98.3 ?F (36.8 ?C), temperature source Oral, height '5\' 8"'$  (1.727 m), weight 97.2 kg, last menstrual period 03/18/2021.  ?AAO x3, no signs of distress ?Cardiovascular: RRR ?Respiratory: Lung fields clear to ausculation ?GU/GI: Abdomen gravid, non-tender, non-distended, active FM, vertex ?Extremities: no edema, negative for pain, tenderness, and cords ? ?Cervical exam:Dilation: Closed ?Effacement (%): 20 ?Station: -3 ?Exam by:: Clois Dupes CNM ?FHR: baseline rate 140 / variability moderate / accelerations present / absent decelerations ?TOCO: irreg ? ? ?Prenatal Transfer Tool  ?Maternal Diabetes: No ?Genetic Screening: Normal ?Maternal Ultrasounds/Referrals: Normal ?Fetal Ultrasounds or other Referrals:  None ?Maternal Substance Abuse:  No ?Significant Maternal Medications:  Meds include: Other: Labetalol 100 mg PO daily ? ?Significant Maternal Lab Results: Group B Strep negative ? ? ? ?Assessment: ?26 y.o. G3P0020 [redacted]w[redacted]d?IOL for CHTN ? ?FHR category 1 ?GBS neg (02/16/22) ?Pain management plan: IV sedation and epidural ? ? ?Plan:  ?Admit to L&D ?Routine admission orders ?CMP ?Protein creatinine ratio ?Epidural PRN ?Cytotec for ripening ?Dr DCharlesetta Garibaldinotified of admission and plan of care ? ?VArrie EasternMSN, CNM ?03/08/2022 10:38 PM ? ?

## 2022-03-09 ENCOUNTER — Encounter (HOSPITAL_COMMUNITY): Payer: Self-pay | Admitting: Obstetrics and Gynecology

## 2022-03-09 ENCOUNTER — Inpatient Hospital Stay (HOSPITAL_COMMUNITY)
Admission: AD | Admit: 2022-03-09 | Discharge: 2022-03-12 | DRG: 807 | Disposition: A | Discharge status: Home / Self Care | Payer: BC Managed Care – PPO | Attending: Obstetrics & Gynecology | Admitting: Obstetrics & Gynecology

## 2022-03-09 ENCOUNTER — Other Ambulatory Visit: Payer: Self-pay

## 2022-03-09 ENCOUNTER — Inpatient Hospital Stay (HOSPITAL_COMMUNITY)
Admission: AD | Admit: 2022-03-09 | Payer: BC Managed Care – PPO | Source: Home / Self Care | Admitting: Obstetrics and Gynecology

## 2022-03-09 ENCOUNTER — Inpatient Hospital Stay (HOSPITAL_COMMUNITY): Payer: BC Managed Care – PPO

## 2022-03-09 DIAGNOSIS — D649 Anemia, unspecified: Secondary | ICD-10-CM | POA: Diagnosis present

## 2022-03-09 DIAGNOSIS — O1002 Pre-existing essential hypertension complicating childbirth: Principal | ICD-10-CM | POA: Diagnosis present

## 2022-03-09 DIAGNOSIS — O9902 Anemia complicating childbirth: Secondary | ICD-10-CM | POA: Diagnosis present

## 2022-03-09 DIAGNOSIS — I10 Essential (primary) hypertension: Secondary | ICD-10-CM | POA: Diagnosis present

## 2022-03-09 DIAGNOSIS — O10919 Unspecified pre-existing hypertension complicating pregnancy, unspecified trimester: Secondary | ICD-10-CM | POA: Diagnosis present

## 2022-03-09 DIAGNOSIS — Z3A38 38 weeks gestation of pregnancy: Secondary | ICD-10-CM

## 2022-03-09 DIAGNOSIS — H209 Unspecified iridocyclitis: Secondary | ICD-10-CM | POA: Diagnosis present

## 2022-03-09 DIAGNOSIS — F419 Anxiety disorder, unspecified: Secondary | ICD-10-CM | POA: Diagnosis present

## 2022-03-09 HISTORY — DX: Posterior synechiae (iris), bilateral: H21.543

## 2022-03-09 HISTORY — DX: Anemia, unspecified: D64.9

## 2022-03-09 HISTORY — DX: Papillomavirus as the cause of diseases classified elsewhere: B97.7

## 2022-03-09 HISTORY — DX: Unspecified chorioretinal inflammation, unspecified eye: H30.90

## 2022-03-09 HISTORY — DX: Retinal edema: H35.81

## 2022-03-09 HISTORY — DX: Anxiety disorder, unspecified: F41.9

## 2022-03-09 HISTORY — DX: Unspecified abnormal cytological findings in specimens from vagina: R87.629

## 2022-03-09 LAB — CBC
HCT: 32.9 % — ABNORMAL LOW (ref 36.0–46.0)
Hemoglobin: 11 g/dL — ABNORMAL LOW (ref 12.0–15.0)
MCH: 29.6 pg (ref 26.0–34.0)
MCHC: 33.4 g/dL (ref 30.0–36.0)
MCV: 88.7 fL (ref 80.0–100.0)
Platelets: 233 10*3/uL (ref 150–400)
RBC: 3.71 MIL/uL — ABNORMAL LOW (ref 3.87–5.11)
RDW: 13.5 % (ref 11.5–15.5)
WBC: 7.2 10*3/uL (ref 4.0–10.5)
nRBC: 0 % (ref 0.0–0.2)

## 2022-03-09 LAB — COMPREHENSIVE METABOLIC PANEL
ALT: 13 U/L (ref 0–44)
AST: 19 U/L (ref 15–41)
Albumin: 2.6 g/dL — ABNORMAL LOW (ref 3.5–5.0)
Alkaline Phosphatase: 166 U/L — ABNORMAL HIGH (ref 38–126)
Anion gap: 8 (ref 5–15)
BUN: <5 mg/dL — ABNORMAL LOW (ref 6–20)
CO2: 24 mmol/L (ref 22–32)
Calcium: 9.2 mg/dL (ref 8.9–10.3)
Chloride: 107 mmol/L (ref 98–111)
Creatinine, Ser: 0.76 mg/dL (ref 0.44–1.00)
GFR, Estimated: >60 mL/min (ref >60)
Glucose, Bld: 79 mg/dL (ref 70–99)
Potassium: 3.5 mmol/L (ref 3.5–5.1)
Sodium: 139 mmol/L (ref 135–145)
Total Bilirubin: 0.6 mg/dL (ref 0.3–1.2)
Total Protein: 6.7 g/dL (ref 6.5–8.1)

## 2022-03-09 LAB — PROTEIN / CREATININE RATIO, URINE
Creatinine, Urine: 127.89 mg/dL
Protein Creatinine Ratio: 0.09 mg/mg{Cre} (ref 0.00–0.15)
Total Protein, Urine: 12 mg/dL

## 2022-03-09 LAB — RPR: RPR Ser Ql: NONREACTIVE

## 2022-03-09 MED ORDER — OXYTOCIN-SODIUM CHLORIDE 30-0.9 UT/500ML-% IV SOLN
2.5000 [IU]/h | INTRAVENOUS | Status: DC
  Administered 2022-03-11: 2.5 [IU]/h via INTRAVENOUS
  Filled 2022-03-09: qty 500

## 2022-03-09 MED ORDER — LACTATED RINGERS IV SOLN: 500.0000 mL | INTRAVENOUS | Status: DC | PRN

## 2022-03-09 MED ORDER — LABETALOL HCL 5 MG/ML IV SOLN: 80.0000 mg | INTRAVENOUS | Status: DC | PRN

## 2022-03-09 MED ORDER — TERBUTALINE SULFATE 1 MG/ML IJ SOLN
0.2500 mg | Freq: Once | INTRAMUSCULAR | Status: DC | PRN
  Filled 2022-03-09: qty 1

## 2022-03-09 MED ORDER — ACETAMINOPHEN 325 MG PO TABS
650.0000 mg | ORAL_TABLET | ORAL | Status: DC | PRN
  Filled 2022-03-09: qty 2

## 2022-03-09 MED ORDER — OXYTOCIN-SODIUM CHLORIDE 30-0.9 UT/500ML-% IV SOLN
1.0000 m[IU]/min | INTRAVENOUS | Status: DC
  Administered 2022-03-09: 1 m[IU]/min via INTRAVENOUS
  Filled 2022-03-09: qty 500

## 2022-03-09 MED ORDER — LABETALOL HCL 5 MG/ML IV SOLN
40.0000 mg | INTRAVENOUS | Status: DC | PRN
  Filled 2022-03-09: qty 8

## 2022-03-09 MED ORDER — LIDOCAINE HCL (PF) 1 % IJ SOLN: 30.0000 mL | INTRAMUSCULAR | Status: DC | PRN

## 2022-03-09 MED ORDER — FENTANYL CITRATE (PF) 100 MCG/2ML IJ SOLN
50.0000 ug | INTRAMUSCULAR | Status: DC | PRN
  Administered 2022-03-09: 100 ug via INTRAVENOUS
  Administered 2022-03-09: 50 ug via INTRAVENOUS
  Administered 2022-03-10 (×5): 100 ug via INTRAVENOUS
  Filled 2022-03-09 (×8): qty 2

## 2022-03-09 MED ORDER — LACTATED RINGERS IV SOLN: INTRAVENOUS | Status: DC

## 2022-03-09 MED ORDER — OXYTOCIN BOLUS FROM INFUSION
333.0000 mL | Freq: Once | INTRAVENOUS | Status: AC
  Administered 2022-03-10: 333 mL via INTRAVENOUS

## 2022-03-09 MED ORDER — TERBUTALINE SULFATE 1 MG/ML IJ SOLN
0.2500 mg | Freq: Once | INTRAMUSCULAR | Status: AC | PRN
  Administered 2022-03-10: 0.25 mg via SUBCUTANEOUS

## 2022-03-09 MED ORDER — HYDROXYZINE HCL 50 MG PO TABS: 50.0000 mg | ORAL_TABLET | Freq: Four times a day (QID) | ORAL | Status: DC | PRN

## 2022-03-09 MED ORDER — LABETALOL HCL 100 MG PO TABS
100.0000 mg | ORAL_TABLET | Freq: Two times a day (BID) | ORAL | Status: DC
  Administered 2022-03-09 – 2022-03-12 (×6): 100 mg via ORAL
  Filled 2022-03-09 (×7): qty 1

## 2022-03-09 MED ORDER — ONDANSETRON HCL 4 MG/2ML IJ SOLN
4.0000 mg | Freq: Four times a day (QID) | INTRAMUSCULAR | Status: DC | PRN
  Administered 2022-03-10: 4 mg via INTRAVENOUS
  Filled 2022-03-09: qty 2

## 2022-03-09 MED ORDER — SOD CITRATE-CITRIC ACID 500-334 MG/5ML PO SOLN: 30.0000 mL | ORAL | Status: DC | PRN

## 2022-03-09 MED ORDER — HYDRALAZINE HCL 20 MG/ML IJ SOLN: 10.0000 mg | INTRAMUSCULAR | Status: DC | PRN

## 2022-03-09 MED ORDER — LABETALOL HCL 5 MG/ML IV SOLN
20.0000 mg | INTRAVENOUS | Status: DC | PRN
  Administered 2022-03-09 – 2022-03-10 (×6): 20 mg via INTRAVENOUS
  Filled 2022-03-09 (×5): qty 4

## 2022-03-09 MED ORDER — MISOPROSTOL 50MCG HALF TABLET
50.0000 ug | ORAL_TABLET | ORAL | Status: DC | PRN
  Administered 2022-03-09 (×2): 50 ug via ORAL
  Filled 2022-03-09 (×2): qty 1

## 2022-03-09 NOTE — Progress Notes (Signed)
Tracing reviewed remotely cat 1 ?

## 2022-03-09 NOTE — Progress Notes (Signed)
Subjective:   ? ?Doing well. Discussed plan of care and expected length of induction. Questions answered. Will stop Pitocin now and repeat Cytotec as cervix remains closed.  ? ?Objective:   ? ?VS: BP (!) 161/95 (BP Location: Right Arm)   Pulse 64   Temp 97.9 ?F (36.6 ?C) (Oral)   Resp 17   Ht '5\' 8"'$  (1.727 m)   Wt 97.2 kg   LMP 03/18/2021   BMI 32.58 kg/m?  ?FHR : baseline 135 / variability moderate / accelerations present / absent decelerations ?Toco: contractions every 2-4 minutes  ?Membranes: intact ?Dilation: Closed ?Effacement (%): Thick ?Station: -3 ?Presentation: Vertex ?Exam by:: Burman Foster CNM ?Pitocin 6 mU/min, discontinued ? ?Assessment/Plan:  ? ?26 y.o. G3P0020 [redacted]w[redacted]d?IOL for CHTN ? ?Labor:  Cytotec x2, low-dose Pitocin, will D/C Pitocin and resume ripening w/ Cytotec ?CHTN:  labs stable and asymptomatic. Continue Labetalol PO 100 mg BID ?Fetal Wellbeing:  Category I ?Pain Control:  IV pain meds ?I/D:   GBS neg ?Anticipated MOD:  NSVD ? ?VArrie EasternMSN, CNM ?03/09/2022 8:01 PM ? ?

## 2022-03-09 NOTE — Progress Notes (Signed)
Subjective:   ? ?Becoming more uncomfortable with contractions. Nitrous gas effective for pain relief. Will defer VE due to duration of ROM. ? ?Objective:   ? ?VS: BP 140/83   Pulse 65   Temp 97.9 ?F (36.6 ?C) (Oral)   Resp 17   Ht '5\' 8"'$  (1.727 m)   Wt 97.2 kg   LMP 03/18/2021   BMI 32.58 kg/m?  ?FHR : baseline 135 / variability moderate / accelerations present / absent decelerations ?Toco: contractions every 3 minutes  ?Membranes: SROM x20 hrs ?VE deffered ?Pitocin 10 mU/min ? ?Assessment/Plan:  ? ?26 y.o. G3P0020 [redacted]w[redacted]d? ?Labor:  progressing ?Preeclampsia:  no signs or symptoms of toxicity and intake and ouput balanced ?Fetal Wellbeing:  Category I ?Pain Control:  Nitrous Oxide ?I/D:   GBS neg ?Anticipated MOD:  NSVD ? ?VArrie EasternMSN, CNM ?03/09/2022 11:42 PM ? ?

## 2022-03-10 ENCOUNTER — Encounter (HOSPITAL_COMMUNITY): Payer: Self-pay | Admitting: Obstetrics and Gynecology

## 2022-03-10 ENCOUNTER — Inpatient Hospital Stay (HOSPITAL_COMMUNITY): Payer: BC Managed Care – PPO | Admitting: Anesthesiology

## 2022-03-10 LAB — CBC
HCT: 35.4 % — ABNORMAL LOW (ref 36.0–46.0)
HCT: 33.8 % — ABNORMAL LOW (ref 36.0–46.0)
Hemoglobin: 11.5 g/dL — ABNORMAL LOW (ref 12.0–15.0)
Hemoglobin: 11.4 g/dL — ABNORMAL LOW (ref 12.0–15.0)
MCH: 29 pg (ref 26.0–34.0)
MCH: 29.8 pg (ref 26.0–34.0)
MCHC: 32.5 g/dL (ref 30.0–36.0)
MCHC: 33.7 g/dL (ref 30.0–36.0)
MCV: 89.2 fL (ref 80.0–100.0)
MCV: 88.3 fL (ref 80.0–100.0)
Platelets: 225 10*3/uL (ref 150–400)
Platelets: 214 10*3/uL (ref 150–400)
RBC: 3.97 MIL/uL (ref 3.87–5.11)
RBC: 3.83 MIL/uL — ABNORMAL LOW (ref 3.87–5.11)
RDW: 13.5 % (ref 11.5–15.5)
RDW: 13.6 % (ref 11.5–15.5)
WBC: 9.5 10*3/uL (ref 4.0–10.5)
WBC: 12.9 10*3/uL — ABNORMAL HIGH (ref 4.0–10.5)
nRBC: 0 % (ref 0.0–0.2)
nRBC: 0 % (ref 0.0–0.2)

## 2022-03-10 LAB — TYPE AND SCREEN
ABO/RH(D): A POS
Antibody Screen: NEGATIVE

## 2022-03-10 MED ORDER — LABETALOL HCL 5 MG/ML IV SOLN: 40.0000 mg | INTRAVENOUS | Status: DC | PRN

## 2022-03-10 MED ORDER — LABETALOL HCL 5 MG/ML IV SOLN: 80.0000 mg | INTRAVENOUS | Status: DC | PRN

## 2022-03-10 MED ORDER — HYDRALAZINE HCL 20 MG/ML IJ SOLN: 10.0000 mg | INTRAMUSCULAR | Status: DC | PRN

## 2022-03-10 MED ORDER — MISOPROSTOL 50MCG HALF TABLET
50.0000 ug | ORAL_TABLET | ORAL | Status: DC | PRN
  Administered 2022-03-10 (×2): 50 ug via ORAL
  Filled 2022-03-10 (×2): qty 1

## 2022-03-10 MED ORDER — MAGNESIUM SULFATE BOLUS VIA INFUSION
4.0000 g | Freq: Once | INTRAVENOUS | Status: AC
  Administered 2022-03-10: 4 g via INTRAVENOUS
  Filled 2022-03-10: qty 1000

## 2022-03-10 MED ORDER — ACETAMINOPHEN 325 MG PO TABS
650.0000 mg | ORAL_TABLET | ORAL | Status: DC | PRN
  Administered 2022-03-11 (×2): 650 mg via ORAL
  Filled 2022-03-10: qty 2

## 2022-03-10 MED ORDER — LACTATED RINGERS IV SOLN
500.0000 mL | Freq: Once | INTRAVENOUS | Status: DC
Start: 2022-03-10 — End: 2022-03-12

## 2022-03-10 MED ORDER — TERBUTALINE SULFATE 1 MG/ML IJ SOLN: 0.2500 mg | Freq: Once | INTRAMUSCULAR | Status: DC | PRN

## 2022-03-10 MED ORDER — COCONUT OIL OIL: 1.0000 "application " | TOPICAL_OIL | Status: DC | PRN

## 2022-03-10 MED ORDER — IBUPROFEN 600 MG PO TABS
600.0000 mg | ORAL_TABLET | Freq: Four times a day (QID) | ORAL | Status: DC
  Administered 2022-03-10 – 2022-03-12 (×6): 600 mg via ORAL
  Filled 2022-03-10 (×6): qty 1

## 2022-03-10 MED ORDER — ACETAMINOPHEN 500 MG PO TABS
1000.0000 mg | ORAL_TABLET | Freq: Once | ORAL | Status: AC
  Administered 2022-03-10: 1000 mg via ORAL
  Filled 2022-03-10: qty 2

## 2022-03-10 MED ORDER — OXYTOCIN-SODIUM CHLORIDE 30-0.9 UT/500ML-% IV SOLN
1.0000 m[IU]/min | INTRAVENOUS | Status: DC
  Administered 2022-03-10: 2 m[IU]/min via INTRAVENOUS

## 2022-03-10 MED ORDER — WITCH HAZEL-GLYCERIN EX PADS
1.0000 "application " | MEDICATED_PAD | CUTANEOUS | Status: DC | PRN
  Administered 2022-03-10: 1 via TOPICAL

## 2022-03-10 MED ORDER — DIBUCAINE (PERIANAL) 1 % EX OINT: 1.0000 "application " | TOPICAL_OINTMENT | CUTANEOUS | Status: DC | PRN

## 2022-03-10 MED ORDER — ONDANSETRON HCL 4 MG PO TABS: 4.0000 mg | ORAL_TABLET | ORAL | Status: DC | PRN

## 2022-03-10 MED ORDER — PHENYLEPHRINE 40 MCG/ML (10ML) SYRINGE FOR IV PUSH (FOR BLOOD PRESSURE SUPPORT): 80.0000 ug | PREFILLED_SYRINGE | INTRAVENOUS | Status: DC | PRN

## 2022-03-10 MED ORDER — MAGNESIUM SULFATE 40 GM/1000ML IV SOLN
2.0000 g/h | INTRAVENOUS | Status: AC
  Administered 2022-03-11: 2 g/h via INTRAVENOUS
  Filled 2022-03-10 (×2): qty 1000

## 2022-03-10 MED ORDER — DIPHENHYDRAMINE HCL 25 MG PO CAPS: 25.0000 mg | ORAL_CAPSULE | Freq: Four times a day (QID) | ORAL | Status: DC | PRN

## 2022-03-10 MED ORDER — BENZOCAINE-MENTHOL 20-0.5 % EX AERO
1.0000 "application " | INHALATION_SPRAY | CUTANEOUS | Status: DC | PRN
  Administered 2022-03-10: 1 via TOPICAL
  Filled 2022-03-10: qty 56

## 2022-03-10 MED ORDER — LABETALOL HCL 5 MG/ML IV SOLN: 20.0000 mg | INTRAVENOUS | Status: DC | PRN

## 2022-03-10 MED ORDER — FENTANYL-BUPIVACAINE-NACL 0.5-0.125-0.9 MG/250ML-% EP SOLN
12.0000 mL/h | EPIDURAL | Status: DC | PRN
  Administered 2022-03-10: 12 mL/h via EPIDURAL
  Filled 2022-03-10: qty 250

## 2022-03-10 MED ORDER — ONDANSETRON HCL 4 MG/2ML IJ SOLN: 4.0000 mg | INTRAMUSCULAR | Status: DC | PRN

## 2022-03-10 MED ORDER — OXYCODONE HCL 5 MG PO TABS: 10.0000 mg | ORAL_TABLET | ORAL | Status: DC | PRN

## 2022-03-10 MED ORDER — OXYCODONE HCL 5 MG PO TABS: 5.0000 mg | ORAL_TABLET | ORAL | Status: DC | PRN

## 2022-03-10 MED ORDER — EPHEDRINE 5 MG/ML INJ
10.0000 mg | INTRAVENOUS | Status: DC | PRN
Start: 2022-03-10 — End: 2022-03-12

## 2022-03-10 MED ORDER — PRENATAL MULTIVITAMIN CH
1.0000 | ORAL_TABLET | Freq: Every day | ORAL | Status: DC
  Administered 2022-03-11 – 2022-03-12 (×2): 1 via ORAL
  Filled 2022-03-10 (×2): qty 1

## 2022-03-10 MED ORDER — DIPHENHYDRAMINE HCL 50 MG/ML IJ SOLN: 12.5000 mg | INTRAMUSCULAR | Status: DC | PRN

## 2022-03-10 MED ORDER — SIMETHICONE 80 MG PO CHEW: 80.0000 mg | CHEWABLE_TABLET | ORAL | Status: DC | PRN

## 2022-03-10 MED ORDER — LIDOCAINE HCL (PF) 1 % IJ SOLN
INTRAMUSCULAR | Status: DC | PRN
  Administered 2022-03-10: 10 mL via EPIDURAL

## 2022-03-10 MED ORDER — EPHEDRINE 5 MG/ML INJ: 10.0000 mg | INTRAVENOUS | Status: DC | PRN

## 2022-03-10 MED ORDER — MISOPROSTOL 25 MCG QUARTER TABLET: 25.0000 ug | ORAL_TABLET | ORAL | Status: DC | PRN

## 2022-03-10 MED ORDER — ZOLPIDEM TARTRATE 5 MG PO TABS
5.0000 mg | ORAL_TABLET | Freq: Every evening | ORAL | Status: DC | PRN
Start: 2022-03-10 — End: 2022-03-12

## 2022-03-10 MED ORDER — LACTATED RINGERS IV SOLN: INTRAVENOUS | Status: DC

## 2022-03-10 MED ORDER — SENNOSIDES-DOCUSATE SODIUM 8.6-50 MG PO TABS
2.0000 | ORAL_TABLET | Freq: Every day | ORAL | Status: DC
  Administered 2022-03-11 – 2022-03-12 (×2): 2 via ORAL
  Filled 2022-03-10 (×2): qty 2

## 2022-03-10 NOTE — Lactation Note (Signed)
This note was copied from a baby's chart. ?Lactation Consultation Note ? ?Patient Name: Karen Young ?Today's Date: 03/10/2022 ?Reason for consult: L&D Initial assessment;Term;Primapara;1st time breastfeeding ?Age:27 hours ? ?L&D consult with >60 minutes old infant and P1 mother. Congratulated family on newborn. Assisted with skin to skin prone on mother's chest. Offered assistance with latch, laid back position. Several attempts to latch unsuccessful, newborn placed back to skin to skin.  ? ?Discussed STS as ideal transition for infants after birth. Talked about primal reflexes. Explained [facility] services availability during postpartum stay. Thanked family for their time.   ? ? ?Maternal Data ?Has patient been taught Hand Expression?: Yes ?Does the patient have breastfeeding experience prior to this delivery?: No ? ?Feeding ?Mother's Current Feeding Choice: Breast Milk ? ?LATCH Score ?Latch: Too sleepy or reluctant, no latch achieved, no sucking elicited. ? ?Audible Swallowing: None ? ?Type of Nipple: Everted at rest and after stimulation ? ?Comfort (Breast/Nipple): Soft / non-tender ? ?Hold (Positioning): Assistance needed to correctly position infant at breast and maintain latch. ? ?LATCH Score: 5 ? ?Interventions ?Interventions: Assisted with latch;Skin to skin;Hand express;Breast massage;Support pillows;Education ? ?Discharge ?Pump: Personal ?[place] Program: No ? ?Consult Status ?Consult Status: Follow-up from L&D ?Date: 03/10/22 ?Follow-up type: In-patient ? ?Jeffie Pollock -P2P student ? ?Emeterio Balke A Higuera Ancidey ?03/10/2022, 7:55 PM ? ? ? ?

## 2022-03-10 NOTE — Lactation Note (Signed)
This note was copied from a baby's chart. ?Lactation Consultation Note ? ?Patient Name: Karen Young ?Today's Date: 03/10/2022 ?Reason for consult: Initial assessment;1st time breastfeeding;Term (Mom on Mag see mom's MR) ?Age:27 hours ?Mom requested for [facility] services. ?Mom attempted to latch infant on her left breast using the football hold position, infant only held nipple in mouth did not elicit sucking or swallowing at this time. ?Mom did hand expression, per mom she already knows how to hand express, she did hand expression during her pregnancy. ?Infant was given 4 mls of colostrum by spoon, afterwards mom did skin to skin with infant. ?Mom will continue to work towards latching infant at the breast , ask RN/LC for further latch assistance if needed. ?Mom will BF infant according to hunger cues, 8 to 12 times or more within 24 hours. ?Mom knows to hand express and give infant by her EBM by spoon. ?Mom made aware of O/P services, breastfeeding support groups, community resources, and our phone # for post-discharge questions.   ? ?Maternal Data ?Has patient been taught Hand Expression?: Yes ?Does the patient have breastfeeding experience prior to this delivery?: No ? ?Feeding ?Mother's Current Feeding Choice: Breast Milk and Formula ? ?LATCH Score ?Latch: Too sleepy or reluctant, no latch achieved, no sucking elicited. ? ?Audible Swallowing: None ? ?Type of Nipple: Everted at rest and after stimulation ? ?Comfort (Breast/Nipple): Soft / non-tender ? ?Hold (Positioning): Assistance needed to correctly position infant at breast and maintain latch. ? ?LATCH Score: 5 ? ? ?Lactation Tools Discussed/Used ?  ? ?Interventions ?Interventions: Breast feeding basics reviewed;Assisted with latch;Skin to skin;Hand express;Breast compression;Adjust position;Support pillows;Position options;Expressed milk;Education;LC Services brochure ? ?Discharge ?Pump: Personal ?[place] Program: No ? ?Consult Status ?Consult Status:  Follow-up ?Date: 03/11/22 ?Follow-up type: In-patient ? ? ? ?Vicente Serene ?03/10/2022, 11:41 PM ? ? ? ?

## 2022-03-10 NOTE — Progress Notes (Signed)
Discussed blood pressures in severe range and need for magnesium sulfate and questions answered.  ?

## 2022-03-10 NOTE — Progress Notes (Signed)
Karen Young is a 26 y.o. G3P0020 at 44w1dby admitted for IOL for chronic HTN ? ?Subjective: ?Patient with strong contractions desires pain medication.  ? ?Objective: ?BP (!) 162/93   Pulse 64   Temp 98.5 ?F (36.9 ?C) (Oral)   Resp 16   Ht '5\' 8"'$  (1.727 m)   Wt 97.2 kg   LMP 03/18/2021   BMI 32.58 kg/m?  ?No intake/output data recorded. ?No intake/output data recorded. ?Vitals:  ? 03/10/22 1305 03/10/22 1310 03/10/22 1315 03/10/22 1320  ?BP: (!) 155/92 (!) 152/87  (!) 162/93  ?Pulse: 69 74 65 64  ?Resp: '16 17 17 16  '$ ?Temp:      ?TempSrc:      ?Weight:      ?Height:      ?  ? ?FHT:  FHR: 120 bpm, variability: moderate,  accelerations:  Present,  decelerations:  Absent ?UC:   irregular, every 3 to 5 minutes ?SVE:   Dilation: Closed ?Effacement (%): 50 ?Station: -3 ?Exam by:: B Mungaray RN ?Unable to Place Intracervical Foley catheter as patient had spontaneous rupture of membranes, blood tinged fluid at 10.20 am while attempting foley placement.  ?Labs: ?Lab Results  ?Component Value Date  ? WBC 9.5 03/10/2022  ? HGB 11.5 (L) 03/10/2022  ? HCT 35.4 (L) 03/10/2022  ? MCV 89.2 03/10/2022  ? PLT 225 03/10/2022  ? ? ?Assessment / Plan: ?Induction of labor for chronic HTN, stable ? ?Labor:  With SROM, will start pitocin.  ?Preeclampsia:   Chronic HTN with severe range BPs, for IV and oral labetalol.  ?Fetal Wellbeing:  Category I ?Pain Control:  IV pain meds ?I/D:   GBS negative ?Anticipated MOD:  NSVD ? ?EArchie Endo MD.  ?03/10/2022, 1:21 PM ? ? ?

## 2022-03-10 NOTE — Anesthesia Procedure Notes (Signed)
Epidural ?Patient location during procedure: OB ?Start time: 03/10/2022 12:47 PM ?End time: 03/10/2022 12:50 PM ? ?Staffing ?Anesthesiologist: Lyn Hollingshead, MD ?Performed: anesthesiologist  ? ?Preanesthetic Checklist ?Completed: patient identified, IV checked, site marked, risks and benefits discussed, surgical consent, monitors and equipment checked, pre-op evaluation and timeout performed ? ?Epidural ?Patient position: sitting ?Prep: DuraPrep and site prepped and draped ?Patient monitoring: continuous pulse ox and blood pressure ?Approach: midline ?Location: L3-L4 ?Injection technique: LOR air ? ?Needle:  ?Needle type: Tuohy  ?Needle gauge: 17 G ?Needle length: 9 cm and 9 ?Needle insertion depth: 5 cm cm ?Catheter type: closed end flexible ?Catheter size: 19 Gauge ?Catheter at skin depth: 10 cm ?Test dose: negative and Other ? ?Assessment ?Events: blood not aspirated, injection not painful, no injection resistance, no paresthesia and negative IV test ? ?Additional Notes ?Reason for block:procedure for pain ? ? ? ?

## 2022-03-10 NOTE — Progress Notes (Signed)
Requested a second opinion with VE from Dr. Landry Mellow. MD to bedside VE-closed/50%/posterior/-2. Discussed repeating oral Cytotec and questions answered.  ? ?Subjective:   ? ?Coping well with contractions and using IV sedation for relief.  ? ?Objective:   ? ?VS: BP (!) 158/94   Pulse 63   Temp 97.9 ?F (36.6 ?C) (Oral)   Resp 17   Ht '5\' 8"'$  (1.727 m)   Wt 97.2 kg   LMP 03/18/2021   BMI 32.58 kg/m?  ?FHR : baseline 135 / variability moderate / accelerations present / absent decelerations ?Toco: contractions every 2-4 minutes  ?Membranes: intact ?Dilation: Closed ?Effacement (%): 50 ?Cervical Position: Posterior ?Station: -2 ?Presentation: Vertex ?Exam by:: Dr Landry Mellow ? ?Assessment/Plan:  ? ?26 y.o. G3P0020 [redacted]w[redacted]d?IOL for CHTN ? ?Labor:  4th oral Cytotec now ?Preeclampsia:  no signs or symptoms of toxicity and intake and ouput balanced ?Fetal Wellbeing:  Category I ?Pain Control:  IV pain meds ?I/D:   GBS neg ?Anticipated MOD:  NSVD ? ?Karen EasternMSN, CNM ?03/10/2022 12:58 AM ? ?

## 2022-03-10 NOTE — Progress Notes (Signed)
Subjective:   ? ?Pt asleep. VE deferred to allow for rest.  ? ?Objective:   ? ?VS: BP (!) 156/97   Pulse 64   Temp 97.8 ?F (36.6 ?C)   Resp 17   Ht '5\' 8"'$  (1.727 m)   Wt 97.2 kg   LMP 03/18/2021   BMI 32.58 kg/m?  ?FHR : baseline 135 / variability moderate / accelerations present / absent decelerations ?Toco: contractions every 2-3 minutes  ?Membranes: intact ?Dilation: Closed ?Effacement (%): 50 ?Cervical Position: Posterior ?Station: -3 ?Presentation: Vertex ?Exam by:: Cassie Freer, RN ? ? ?Assessment/Plan:  ? ?26 y.o. G3P0020 101w1d?IOL for CHTN ?  ?Labor:  oral Cytotec x5 ?Preeclampsia:  no signs or symptoms of toxicity and intake and ouput balanced ?Fetal Wellbeing:  Category I ?Pain Control:  IV pain meds ?I/D:   GBS neg ?Anticipated MOD:  NSVD ?VArrie EasternMSN, CNM ?03/10/2022 7:03 AM ? ?

## 2022-03-10 NOTE — Anesthesia Preprocedure Evaluation (Addendum)
Anesthesia Evaluation  ?Patient identified by MRN, date of birth, ID band ?Patient awake ? ? ? ?Reviewed: ?Allergy & Precautions, H&P , Patient's Chart, lab work & pertinent test results ? ?Airway ?Mallampati: II ? ?TM Distance: >3 FB ?Neck ROM: full ? ? ? Dental ?no notable dental hx. ? ?  ?Pulmonary ?neg pulmonary ROS,  ?  ?Pulmonary exam normal ? ? ? ? ? ? ? Cardiovascular ?hypertension, Normal cardiovascular exam ? ? ?  ?Neuro/Psych ?negative neurological ROS ?   ? GI/Hepatic ?negative GI ROS, Neg liver ROS,   ?Endo/Other  ?negative endocrine ROS ? Renal/GU ?negative Renal ROS  ? ?  ?Musculoskeletal ? ? Abdominal ?(+) + obese,   ?Peds ? Hematology ?  ?Anesthesia Other Findings ? ? Reproductive/Obstetrics ?(+) Pregnancy ? ?  ? ? ? ? ? ? ? ? ? ? ? ? ? ?  ?  ? ? ? ? ? ? ? ?Anesthesia Physical ?Anesthesia Plan ? ?ASA: 2 ? ?Anesthesia Plan: Epidural  ? ?Post-op Pain Management:   ? ?Induction:  ? ?PONV Risk Score and Plan:  ? ?Airway Management Planned:  ? ?Additional Equipment:  ? ?Intra-op Plan:  ? ?Post-operative Plan:  ? ?Informed Consent: I have reviewed the patients History and Physical, chart, labs and discussed the procedure including the risks, benefits and alternatives for the proposed anesthesia with the patient or authorized representative who has indicated his/her understanding and acceptance.  ? ? ? ? ? ?Plan Discussed with:  ? ?Anesthesia Plan Comments:   ? ? ? ? ? ? ?Anesthesia Quick Evaluation ? ?

## 2022-03-11 LAB — CBC
HCT: 31.8 % — ABNORMAL LOW (ref 36.0–46.0)
Hemoglobin: 10.3 g/dL — ABNORMAL LOW (ref 12.0–15.0)
MCH: 29 pg (ref 26.0–34.0)
MCHC: 32.4 g/dL (ref 30.0–36.0)
MCV: 89.6 fL (ref 80.0–100.0)
Platelets: 205 10*3/uL (ref 150–400)
RBC: 3.55 MIL/uL — ABNORMAL LOW (ref 3.87–5.11)
RDW: 13.5 % (ref 11.5–15.5)
WBC: 12.1 10*3/uL — ABNORMAL HIGH (ref 4.0–10.5)
nRBC: 0 % (ref 0.0–0.2)

## 2022-03-11 LAB — COMPREHENSIVE METABOLIC PANEL
ALT: 16 U/L (ref 0–44)
AST: 37 U/L (ref 15–41)
Albumin: 2.3 g/dL — ABNORMAL LOW (ref 3.5–5.0)
Alkaline Phosphatase: 153 U/L — ABNORMAL HIGH (ref 38–126)
Anion gap: 3 — ABNORMAL LOW (ref 5–15)
BUN: <5 mg/dL — ABNORMAL LOW (ref 6–20)
CO2: 25 mmol/L (ref 22–32)
Calcium: 8.2 mg/dL — ABNORMAL LOW (ref 8.9–10.3)
Chloride: 110 mmol/L (ref 98–111)
Creatinine, Ser: 0.89 mg/dL (ref 0.44–1.00)
GFR, Estimated: >60 mL/min (ref >60)
Glucose, Bld: 88 mg/dL (ref 70–99)
Potassium: 3.2 mmol/L — ABNORMAL LOW (ref 3.5–5.1)
Sodium: 138 mmol/L (ref 135–145)
Total Bilirubin: 0.4 mg/dL (ref 0.3–1.2)
Total Protein: 5.7 g/dL — ABNORMAL LOW (ref 6.5–8.1)

## 2022-03-11 MED ORDER — TRANEXAMIC ACID-NACL 1000-0.7 MG/100ML-% IV SOLN
1000.0000 mg | Freq: Once | INTRAVENOUS | Status: AC
  Administered 2022-03-11: 1000 mg via INTRAVENOUS
  Filled 2022-03-11: qty 100

## 2022-03-11 NOTE — Progress Notes (Signed)
MOB was referred for history of depression/anxiety. ?* Referral screened out by Clinical Social Worker because none of the following criteria appear to apply: ?~ History of anxiety/depression during this pregnancy, or of post-partum depression following prior delivery. ?~ Diagnosis of anxiety and/or depression within last 3 years ?OR ?* MOB's symptoms currently being treated with medication and/or therapy. MOB has an active Rx for Lexpro and there are no concerns noted in OB records.  ? ?Please contact the Clinical Social Worker if needs arise, by [facility] request, or if MOB scores greater than 9/yes to question 10 on Edinburgh Postpartum Depression Screen.  ? ?Laurey Arrow, MSW, LCSW ?Clinical Social Work ?[phone] ?

## 2022-03-11 NOTE — Progress Notes (Signed)
PPD# 1 VAVD w/ 1st degree laceration ?Information for the patient's newborn:  Karen Young, Karen Young [[id number]]  ?female   ?Baby Name Karen Young ?Circumcision in-patient ? ? ?S:   ?Reports feeling tired ?Tolerating PO fluid and solids ?No nausea or vomiting ?Bleeding is trickling aith small clots when voiding ?Pain controlled with  PO meds ?Up ad lib / ambulatory / voiding w/o difficulty ?Feeding: Breast  ? ? ?O:   ?VS: BP 139/88   Pulse 80   Temp 98.2 ?F (36.8 ?C) (Oral)   Resp 18   Ht '5\' 8"'$  (1.727 m)   Wt 97.2 kg   LMP 03/18/2021   SpO2 100%   Breastfeeding Unknown   BMI 32.58 kg/m?  ? ?LABS:  ?Recent Labs  ?  03/10/22 ?1932 03/11/22 ?0441  ?WBC 12.9* 12.1*  ?HGB 11.4* 10.3*  ?PLT 214 205  ? ?Blood type: --/--/A POS (04/04 4481) ?Rubella: Immune (09/14 0000)       ?             ?  ?I&O: Intake/Output   ?   04/05 0701 ?04/06 0700  ? P.O. 8563  ? I.V. (mL/kg) 1006.4 (10.4)  ? Total Intake(mL/kg) 2566.4 (26.4)  ? Urine (mL/kg/hr) 2350 (1)  ? Blood 50  ? Total Output 2400  ? Net +166.4  ?    ? Urine Occurrence 4 x  ?  ? ?Physical Exam: ?Alert and oriented X3 ?Lungs: Clear and unlabored ?Heart: regular rate and rhythm ?Abdomen: soft, non-tender, non-distended  ?Fundus: firm, non-tender ?Perineum: well-approximated ?Lochia: moderate, firm uterine tone ?Extremities: trace edema, no calf pain, tenderness, or cords ?  ? ?A:  ?PPD # 1  ?Normal exam ? ?P:  ?Routine post partum orders ?Continue Pitocin ?Anticipate D/C on 03/12/22 ?Plan reviewed w/ on-coming MD ? ?Arrie Eastern, MSN, CNM ?03/11/2022, 6:54 AM ? ?

## 2022-03-11 NOTE — Lactation Note (Signed)
This note was copied from a baby's chart. ?Lactation Consultation Note ? ?Patient Name: Karen Young ?Today's Date: 03/11/2022 ?Reason for consult: Follow-up assessment;Difficult latch ?Age:27 hours ? ? ?Mom pumping when LC entered room.  Collected 5 ml.  Has been spoon feeding. Baby's temp was down, placed STS.  Cueing and rooting but jaws were very tight and mouth would not open wide.   LC assessed suck.  No suck at first.  Colostrum on finger and baby would suck then clamp down on finger.  Suck training tried.   ? ?Colostrum placed in bottle and infant did take the bottle, 5 ml of colostrum, then attempted to latch.  A few sucks noted then infant would suck his tongue.  Mom felt his gums when latching.   ? ?[place] encouraged mom to continue pumping every 3 hours and feeding back EBM to baby. ? ?MOm up to bathroom and LC swaddled baby and placed back in the bassinet. ? ?Maternal Data ?  ? ?Feeding ?Mother's Current Feeding Choice: Breast Milk ?Nipple Type: Slow - flow ? ?LATCH Score ?Latch: Too sleepy or reluctant, no latch achieved, no sucking elicited. ? ?Audible Swallowing: None ? ?Type of Nipple: Everted at rest and after stimulation ? ?Comfort (Breast/Nipple): Filling, red/small blisters or bruises, mild/mod discomfort (clamping felt when baby latched) ? ?Hold (Positioning): Assistance needed to correctly position infant at breast and maintain latch. ? ?LATCH Score: 4 ? ? ?Lactation Tools Discussed/Used ?Tools: Pump ?Breast pump type: Double-Electric Breast Pump ?Pump Education: Setup, frequency, and cleaning ?Reason for Pumping: stimulate milk supply/ baby not latching ?Pumping frequency: encouraged every 3 hours ?Pumped volume: 5 mL ? ?Interventions ?Interventions: Breast feeding basics reviewed;Skin to skin;Breast massage;Hand express;Support pillows;Position options;DEBP;Education ? ?Discharge ?Pump: DEBP;Personal ? ?Consult Status ?Consult Status: Follow-up ?Date: 03/12/22 ?Follow-up type:  In-patient ? ? ? ?Ferne Coe Makaelyn Aponte ?03/11/2022, 8:54 AM ? ? ? ?

## 2022-03-11 NOTE — Anesthesia Postprocedure Evaluation (Signed)
Anesthesia Post Note ? ?Patient: Karen Young ? ?Procedure(s) Performed: AN AD HOC LABOR EPIDURAL ? ?  ? ?Patient location during evaluation: Mother Baby ?Anesthesia Type: Epidural ?Level of consciousness: awake ?Pain management: satisfactory to patient ?Vital Signs Assessment: post-procedure vital signs reviewed and stable ?Respiratory status: spontaneous breathing ?Cardiovascular status: stable ?Anesthetic complications: no ? ? ?No notable events documented. ? ?Last Vitals:  ?Vitals:  ? 03/11/22 0600 03/11/22 0628  ?BP: (!) 149/96 139/88  ?Pulse: 81 80  ?Resp: 18   ?Temp: 36.8 ?C   ?SpO2: 100%   ?  ?Last Pain:  ?Vitals:  ? 03/11/22 0600  ?TempSrc: Oral  ?PainSc:   ? ?Pain Goal: Patients Stated Pain Goal: 3 (03/09/22 2310) ? ?  ?  ?  ?  ?  ?  ?  ? ?Karen Young ? ? ? ? ?

## 2022-03-12 ENCOUNTER — Ambulatory Visit: Payer: Self-pay

## 2022-03-12 LAB — SURGICAL PATHOLOGY

## 2022-03-12 MED ORDER — ACETAMINOPHEN 325 MG PO TABS: 650.0000 mg | ORAL_TABLET | ORAL | Status: AC | PRN

## 2022-03-12 MED ORDER — IBUPROFEN 600 MG PO TABS: 600.0000 mg | ORAL_TABLET | Freq: Four times a day (QID) | ORAL | 0 refills | Status: AC

## 2022-03-12 NOTE — Progress Notes (Signed)
Discharge instructions given to patient, all questions ans concerns addressed, and patient verbalized understanding. Follow-up appointment, medications, when to call the MD, and HTN in postpartum discussed. Patient is alert and oriented x4, VS stable, pain stable, and patient is ambulatory. Patient will stay roomed in with infant while infant is still a patient.  ?

## 2022-03-12 NOTE — Plan of Care (Signed)
?  Problem: Education: ?Goal: Knowledge of General Education information will improve ?Description: Including pain rating scale, medication(s)/side effects and non-pharmacologic comfort measures ?Outcome: Adequate for Discharge ?  ?Problem: Health Behavior/Discharge Planning: ?Goal: Ability to manage health-related needs will improve ?Outcome: Adequate for Discharge ?  ?Problem: Clinical Measurements: ?Goal: Ability to maintain clinical measurements within normal limits will improve ?Outcome: Adequate for Discharge ?Goal: Will remain free from infection ?Outcome: Adequate for Discharge ?Goal: Diagnostic test results will improve ?Outcome: Adequate for Discharge ?Goal: Respiratory complications will improve ?Outcome: Adequate for Discharge ?Goal: Cardiovascular complication will be avoided ?Outcome: Adequate for Discharge ?  ?Problem: Activity: ?Goal: Risk for activity intolerance will decrease ?Outcome: Adequate for Discharge ?  ?Problem: Nutrition: ?Goal: Adequate nutrition will be maintained ?Outcome: Adequate for Discharge ?  ?Problem: Coping: ?Goal: Level of anxiety will decrease ?Outcome: Adequate for Discharge ?  ?Problem: Elimination: ?Goal: Will not experience complications related to bowel motility ?Outcome: Adequate for Discharge ?Goal: Will not experience complications related to urinary retention ?Outcome: Adequate for Discharge ?  ?Problem: Pain Managment: ?Goal: General experience of comfort will improve ?Outcome: Adequate for Discharge ?  ?Problem: Safety: ?Goal: Ability to remain free from injury will improve ?Outcome: Adequate for Discharge ?  ?Problem: Skin Integrity: ?Goal: Risk for impaired skin integrity will decrease ?Outcome: Adequate for Discharge ?  ?Problem: Education: ?Goal: Knowledge of Childbirth will improve ?Outcome: Adequate for Discharge ?Goal: Ability to make informed decisions regarding treatment and plan of care will improve ?Outcome: Adequate for Discharge ?Goal: Ability to state  and carry out methods to decrease the pain will improve ?Outcome: Adequate for Discharge ?Goal: Individualized Educational Video(s) ?Outcome: Adequate for Discharge ?  ?Problem: Coping: ?Goal: Ability to verbalize concerns and feelings about labor and delivery will improve ?Outcome: Adequate for Discharge ?  ?Problem: Life Cycle: ?Goal: Ability to make normal progression through stages of labor will improve ?Outcome: Adequate for Discharge ?Goal: Ability to effectively push during vaginal delivery will improve ?Outcome: Adequate for Discharge ?  ?Problem: Role Relationship: ?Goal: Will demonstrate positive interactions with the child ?Outcome: Adequate for Discharge ?  ?Problem: Safety: ?Goal: Risk of complications during labor and delivery will decrease ?Outcome: Adequate for Discharge ?  ?Problem: Pain Management: ?Goal: Relief or control of pain from uterine contractions will improve ?Outcome: Adequate for Discharge ?  ?Problem: Education: ?Goal: Knowledge of condition will improve ?Outcome: Adequate for Discharge ?Goal: Individualized Educational Video(s) ?Outcome: Adequate for Discharge ?Goal: Individualized Newborn Educational Video(s) ?Outcome: Adequate for Discharge ?  ?Problem: Activity: ?Goal: Will verbalize the importance of balancing activity with adequate rest periods ?Outcome: Adequate for Discharge ?Goal: Ability to tolerate increased activity will improve ?Outcome: Adequate for Discharge ?  ?Problem: Coping: ?Goal: Ability to identify and utilize available resources and services will improve ?Outcome: Adequate for Discharge ?  ?Problem: Life Cycle: ?Goal: Chance of risk for complications during the postpartum period will decrease ?Outcome: Adequate for Discharge ?  ?Problem: Role Relationship: ?Goal: Ability to demonstrate positive interaction with newborn will improve ?Outcome: Adequate for Discharge ?  ?Problem: Skin Integrity: ?Goal: Demonstration of wound healing without infection will  improve ?Outcome: Adequate for Discharge ?  ?

## 2022-03-12 NOTE — Discharge Summary (Signed)
? ?  Postpartum Discharge Summary ? ?Date of Service updated 03/12/22 ?   ?Patient Name: Karen Young ?DOB: [birth year] ?MRN: [id number] ? ?Date of admission: 03/09/2022 ?Delivery date:03/10/2022  ?Delivering provider: Alesia Richards, EMA  ?Date of discharge: 03/12/2022 ? ?Admitting diagnosis: Chronic hypertension affecting pregnancy [O10.919] ?Intrauterine pregnancy: [redacted]w[redacted]d    ?Secondary diagnosis:  Principal Problem: ?  Chronic hypertension affecting pregnancy ?Active Problems: ?  Hypertension ?  Uveitis ?  Anxiety ?  Anemia ?  SVD (spontaneous vaginal delivery) ? ?Additional problems: none    ?Discharge diagnosis: Term Pregnancy Delivered and CHTN                                              ?Post partum procedures: noene ?Augmentation: Pitocin and Cytotec ?Complications: None ? ?Hospital course: Induction of Labor With Vaginal Delivery   ?26y.o. yo G3P1021 at 361w1das admitted to the hospital 03/09/2022 for induction of labor.  Indication for induction:  chronic hypertension .  Patient had an uncomplicated labor course as follows: ?Membrane Rupture Time/Date: 10:16 AM ,03/10/2022   ?Delivery Method:Vaginal, Vacuum (Extractor)  ?Episiotomy: None  ?Lacerations:    ?Details of delivery can be found in separate delivery note.  Patient had a routine postpartum course. Patient is discharged home 03/12/22. ? ?Newborn Data: ?Birth date:03/10/2022  ?Birth time:6:08 PM  ?Gender:Female  ?Living status:Living  ?Apgars:1 ,6  ?Weight:2760 g  ? ?Magnesium Sulfate received: Yes: Seizure prophylaxis ?BMZ received: No ?Rhophylac:N/A ?MMR:N/A ?T-DaP: declined ?Flu: No ?Transfusion:No ? ?Physical exam  ?Vitals:  ? 03/11/22 1959 03/11/22 2022 03/12/22 0031 03/12/22 0503  ?BP: (!) 151/93  129/75 135/87  ?Pulse: 77  75 73  ?Resp: _0 ?Temp: 97.8 ?F (36.6 ?C)  97.8 ?F (36.6 ?C) 98.4 ?F (36.9 ?C)  ?TempSrc: Oral  Oral Oral  ?SpO2: 100%  99% 100%  ?Weight:      ?Height:      ? ?General: alert, cooperative, and no distress ?Lochia:  appropriate ?Uterine Fundus: firm ?Incision: N/A ?DVT Evaluation: No evidence of DVT seen on physical exam. ?No cords or calf tenderness. ?No significant calf/ankle edema. ?Labs: ?Lab Results  ?Component Value Date  ? WBC 12.1 (H) 03/11/2022  ? HGB 10.3 (L) 03/11/2022  ? HCT 31.8 (L) 03/11/2022  ? MCV 89.6 03/11/2022  ? PLT 205 03/11/2022  ? ? ?  Latest Ref Rng & Units 03/11/2022  ?  4:41 AM  ?CMP  ?Glucose 70 - 99 mg/dL 88    ?BUN 6 - 20 mg/dL <5    ?Creatinine 0.44 - 1.00 mg/dL 0.89    ?Sodium 135 - 145 mmol/L 138    ?Potassium 3.5 - 5.1 mmol/L 3.2    ?Chloride 98 - 111 mmol/L 110    ?CO2 22 - 32 mmol/L 25    ?Calcium 8.9 - 10.3 mg/dL 8.2    ?Total Protein 6.5 - 8.1 g/dL 5.7    ?Total Bilirubin 0.3 - 1.2 mg/dL 0.4    ?Alkaline Phos 38 - 126 U/L 153    ?AST 15 - 41 U/L 37    ?ALT 0 - 44 U/L 16    ? ?Edinburgh Score: ? ?  03/11/2022  ? 12:31 PM  ?EdFlavia Shipperostnatal Depression Scale Screening Tool  ?I have been able to laugh and see the funny side of things. 0  ?I have looked forward with  enjoyment to things. 0  ?I have blamed myself unnecessarily when things went wrong. 1  ?I have been anxious or worried for no good reason. 2  ?I have felt scared or panicky for no good reason. 1  ?Things have been getting on top of me. 1  ?I have been so unhappy that I have had difficulty sleeping. 0  ?I have felt sad or miserable. 0  ?I have been so unhappy that I have been crying. 0  ?The thought of harming myself has occurred to me. 0  ?Edinburgh Postnatal Depression Scale Total 5  ? ? ? ? ?After visit meds:  ?Allergies as of 03/12/2022   ?No Known Allergies ?  ? ?  ?Medication List  ?  ? ?STOP taking these medications   ? ?azaTHIOprine 50 MG tablet ?Commonly known as: IMURAN ?  ?brimonidine 0.2 % ophthalmic solution ?Commonly known as: ALPHAGAN ?  ?dorzolamide-timolol 22.3-6.8 MG/ML ophthalmic solution ?Commonly known as: COSOPT ?  ?escitalopram 10 MG tablet ?Commonly known as: LEXAPRO ?  ?famotidine 40 MG tablet ?Commonly known as:  PEPCID ?  ?ferrous sulfate 325 (65 FE) MG tablet ?  ?folic acid 1 MG tablet ?Commonly known as: FOLVITE ?  ?Humira Pen 40 MG/0.4ML Pnkt ?Generic drug: Adalimumab ?  ?hydrochlorothiazide 12.5 MG tablet ?Commonly known as: HYDRODIURIL ?  ?hydrOXYzine 25 MG tablet ?Commonly known as: ATARAX ?  ? ?  ? ?TAKE these medications   ? ?acetaminophen 325 MG tablet ?Commonly known as: TYLENOL ?Take 2 tablets (650 mg total) by mouth every 4 (four) hours as needed (for pain scale < 4  OR  temperature  >/=  100.5 F). ?  ?ibuprofen 600 MG tablet ?Commonly known as: ADVIL ?Take 1 tablet (600 mg total) by mouth every 6 (six) hours. ?  ?labetalol 100 MG tablet ?Commonly known as: NORMODYNE ?Take 100 mg by mouth 2 (two) times daily. ?  ?multivitamin with minerals Tabs tablet ?Take 1 tablet by mouth daily. ?  ? ?  ? ? ? ?Discharge home in stable condition ?Infant Feeding: Breast ?Infant Disposition:home with mother ?Discharge instruction: per After Visit Summary and Postpartum booklet. ?Activity: Advance as tolerated. Pelvic rest for 6 weeks.  ?Diet: low salt diet ?Anticipated Birth Control:  Undecided, aware of options ?Postpartum Appointment:6 weeks ?Additional Postpartum F/U: BP check 1 week ?Future Appointments:No future appointments. ?Follow up Visit: ? Follow-up Information   ? ? [facility]. Go to.   ?Specialty: Obstetrics and Gynecology ?Why: Return to [facility] in 1 week for BP check and then in 6 weeks for regular postpartum visit. ?Contact information: ?[address] ?[address] ?[place] [address] ?[phone] ? ?  ?  ? ?  ?  ? ?  ? ? ? ?  ? ?03/12/2022 ?Arrie Eastern, CNM ? ? ?

## 2022-03-12 NOTE — Lactation Note (Signed)
This note was copied from a baby's chart. ?Lactation Consultation Note ? ?Patient Name: Boy Rhema Boyett ?Today's Date: 03/12/2022 ?Reason for consult: Follow-up assessment;Other (Comment) (LC called [facility] RN Odis Hollingshead to check to see if mom was relieved from engorgement from icing and pumping. per RN was relieved and mom planned to pump again prior to d/C.) ?Age:27 hours ? ?Maternal Data ?Has patient been taught Hand Expression?: Yes ? ?Feeding ?Mother's Current Feeding Choice: Breast Milk and Formula ?Nipple Type: Slow - flow ? ?LATCH Score ?  ? ?  ? ?  ? ?  ? ?  ? ?  ? ? ?Lactation Tools Discussed/Used ?Tools: Pump;Flanges ?Flange Size: 24;27 ?Breast pump type: Double-Electric Breast Pump ? ?Interventions ?  ? ?Discharge ?Discharge Education: Engorgement and breast care;Warning signs for feeding baby;Outpatient recommendation;Other (comment) (LC offered and mom declined - per mom has a doula that will be helping) ?Pump: DEBP;Personal ? ?Consult Status ?Consult Status: Complete ?Date: 03/12/22 ? ? ? ?Jerlyn Ly Keshon Markovitz ?03/12/2022, 12:36 PM ? ? ? ?

## 2022-03-12 NOTE — Lactation Note (Signed)
This note was copied from a baby's chart. ?Lactation Consultation Note ? ?Patient Name: Karen Young ?Today's Date: 03/12/2022 ?Reason for consult: Follow-up assessment;Difficult latch;1st time breastfeeding;Primapara;Term;Infant weight loss;Engorgement;Mother's request ([facility]  aware to page for [facility] if the engorgment not resolved prior to D/C) ?Age:27 hours ?LC present for latch assistance and due to engorgement and baby being hungry needed to just feed the baby EBM and formula.  ?LC provided ice packs for mom with instructions to ice for 15 -20 mins and then pump for 15 mins with #27  Flange. If the engorgement is not improved after pumping inform her RN and she can call LC.  ?LC also recommended after pumping for 15 mins, if breast softened and fill back up in the next hour pump both breast down again.  ?[place] reviewed BF D/C teaching and offered mom a F/U O/P and she declined and mentioned her doula would be helping.  ? ?Maternal Data ?Has patient been taught Hand Expression?: Yes ? ?Feeding ?Mother's Current Feeding Choice: Breast Milk and Formula ?Nipple Type: Slow - flow ? ?LATCH Score ?  ? ?  ? ?  ? ?  ? ?  ? ?  ? ? ?Lactation Tools Discussed/Used ?Tools: Pump;Flanges ?Flange Size: 24;27 ?Breast pump type: Double-Electric Breast Pump ? ?Interventions ?  ? ?Discharge ?Discharge Education: Engorgement and breast care;Warning signs for feeding baby;Outpatient recommendation;Other (comment) (LC offered and mom declined - per mom has a doula that will be helping) ?Pump: DEBP;Personal ? ?Consult Status ?Consult Status: Complete ?Date: 03/12/22 ? ? ? ?[name] ?03/12/2022, 9:51 AM ? ? ? ?

## 2022-03-22 ENCOUNTER — Telehealth (HOSPITAL_COMMUNITY): Payer: Self-pay | Admitting: *Deleted

## 2022-03-22 NOTE — Telephone Encounter (Signed)
No answer. Unable to leave message. ? ?Odis Hollingshead, RN 03-22-2022 at 10:19am ?
